# Patient Record
Sex: Female | Born: 2000
Health system: Southern US, Community
[De-identification: ages and names within clinical notes are randomized; demographics above are authoritative.]

## PROBLEM LIST (undated history)

## (undated) HISTORY — PX: NO PAST SURGERIES: SHX2092

---
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
## 2018-03-03 ENCOUNTER — Other Ambulatory Visit: Payer: Self-pay

## 2018-03-03 ENCOUNTER — Ambulatory Visit
Admission: EM | Admit: 2018-03-03 | Discharge: 2018-03-03 | Disposition: A | Discharge status: Home / Self Care | Payer: No Typology Code available for payment source | Attending: Family Medicine | Admitting: Family Medicine

## 2018-03-03 DIAGNOSIS — J069 Acute upper respiratory infection, unspecified: Secondary | ICD-10-CM | POA: Diagnosis not present

## 2018-03-03 DIAGNOSIS — R0981 Nasal congestion: Secondary | ICD-10-CM | POA: Diagnosis not present

## 2018-03-03 MED ORDER — FLUTICASONE PROPIONATE 50 MCG/ACT NA SUSP: 2.0000 | Freq: Every day | NASAL | 0 refills | Status: DC

## 2018-03-03 MED ORDER — PROMETHAZINE-DM 6.25-15 MG/5ML PO SYRP: 5.0000 mL | ORAL_SOLUTION | Freq: Every evening | ORAL | 0 refills | Status: DC | PRN

## 2018-03-03 NOTE — Discharge Instructions (Signed)
Please continue with over-the-counter Mucinex for cough, congestion and runny nose.  Take Flonase daily.  You may use nighttime cough medication as needed.  Please make sure you are drinking lots of fluids.  If any fevers, shortness of breath, worsening symptoms or urgent changes in her health return to the urgent care facility or ED.

## 2018-03-03 NOTE — ED Triage Notes (Signed)
Pt with headache, nasal congestion, cough, and sore throat starting on Tuesday. Sore throat subsided on Wednesday.

## 2018-03-03 NOTE — ED Provider Notes (Signed)
[facility]    CSN: [id number] Arrival date & time: 03/03/18  1004     History   Chief Complaint Chief Complaint  Patient presents with  . Nasal Congestion  . Headache    HPI Alexandria Hernandez is a 17 y.o. female presents to the urgent care facility for evaluation of sore throat, congestion, headache/ sinus pain, pressure and ear popping.  Symptoms began 3 days ago.  She currently states he is no longer having any sore throat.  Her cough is mild and her chief complaint today is mild nasal congestion.  Patient states she took Mucinex last night which seemed to help with her symptoms.  Her nighttime cough is improving.  She is having a hard time breathing through her nose at nighttime.  She denies any fevers, nausea, vomiting, body aches, headaches.  HPI  History reviewed. No pertinent past medical history.  There are no active problems to display for this patient.   History reviewed. No pertinent surgical history.  OB History    No data available       Home Medications    Prior to Admission medications   Medication Sig Start Date End Date Taking? Authorizing Provider  fluticasone (FLONASE) 50 MCG/ACT nasal spray Place 2 sprays into both nostrils daily. 03/03/18   Evon SlackGaines, Kairah Leoni C, PA-C  promethazine-dextromethorphan (PROMETHAZINE-DM) 6.25-15 MG/5ML syrup Take 5 mLs by mouth at bedtime as needed for cough. 03/03/18   Evon SlackGaines, Laquanda Bick C, PA-C    Family History History reviewed. No pertinent family history.  Social History Social History   Tobacco Use  . Smoking status: Never Smoker  . Smokeless tobacco: Never Used  Substance Use Topics  . Alcohol use: No    Frequency: Never  . Drug use: No     Allergies   Patient has no known allergies.   Review of Systems Review of Systems  Constitutional: Negative for fever.  HENT: Positive for congestion and rhinorrhea. Negative for ear discharge, sinus pressure, sinus pain, sore throat, trouble swallowing and  voice change.   Respiratory: Positive for cough. Negative for shortness of breath, wheezing and stridor.   Cardiovascular: Negative for chest pain.  Gastrointestinal: Negative for abdominal pain, diarrhea, nausea and vomiting.  Genitourinary: Negative for dysuria, flank pain and pelvic pain.  Musculoskeletal: Positive for myalgias. Negative for back pain.  Skin: Negative for rash.  Neurological: Negative for dizziness and headaches.     Physical Exam Triage Vital Signs ED Triage Vitals  Enc Vitals Group     BP 03/03/18 1017 123/77     Pulse Rate 03/03/18 1017 84     Resp 03/03/18 1017 16     Temp 03/03/18 1017 98.6 F (37 C)     Temp Source 03/03/18 1017 Oral     SpO2 03/03/18 1017 100 %     Weight 03/03/18 1018 116 lb (52.6 kg)     Height 03/03/18 1018 5\' 4"  (1.626 m)     Head Circumference --      Peak Flow --      Pain Score 03/03/18 1016 4     Pain Loc --      Pain Edu? --      Excl. in GC? --    No data found.  Updated Vital Signs BP 123/77 (BP Location: Left Arm)   Pulse 84   Temp 98.6 F (37 C) (Oral)   Resp 16   Ht 5\' 4"  (1.626 m)   Wt 116 lb (52.6  kg)   LMP 01/23/2018   SpO2 100%   BMI 19.91 kg/m   Visual Acuity Right Eye Distance:   Left Eye Distance:   Bilateral Distance:    Right Eye Near:   Left Eye Near:    Bilateral Near:     Physical Exam  Constitutional: She is oriented to person, place, and time. She appears well-developed and well-nourished. No distress.  HENT:  Head: Normocephalic and atraumatic.  Right Ear: Hearing, tympanic membrane, external ear and ear canal normal.  Left Ear: Hearing, tympanic membrane, external ear and ear canal normal.  Nose: Rhinorrhea present.  Mouth/Throat: Mucous membranes are normal. No trismus in the jaw. No uvula swelling. Posterior oropharyngeal erythema present. No oropharyngeal exudate, posterior oropharyngeal edema or tonsillar abscesses. No tonsillar exudate.  No sinus tenderness to palpation.    Eyes: Conjunctivae are normal. Right eye exhibits no discharge. Left eye exhibits no discharge.  Neck: Normal range of motion. No neck rigidity.  Cardiovascular: Normal rate and regular rhythm.  Pulmonary/Chest: Effort normal and breath sounds normal. No stridor. No respiratory distress. She has no wheezes. She has no rales.  Abdominal: Soft. She exhibits no distension. There is no tenderness.  Musculoskeletal: Normal range of motion. She exhibits no deformity.  Lymphadenopathy:    She has cervical adenopathy.  Neurological: She is alert and oriented to person, place, and time. She has normal reflexes.  Skin: Skin is warm and dry.  Psychiatric: She has a normal mood and affect. Her behavior is normal. Thought content normal.     UC Treatments / Results  Labs (all labs ordered are listed, but only abnormal results are displayed) Labs Reviewed - No data to display  EKG  EKG Interpretation None       Radiology No results found.  Procedures Procedures (including critical care time)  Medications Ordered in UC Medications - No data to display   Initial Impression / Assessment and Plan / UC Course  I have reviewed the triage vital signs and the nursing notes.  Pertinent labs & imaging results that were available during my care of the patient were reviewed by me and considered in my medical decision making (see chart for details).     17 year old female with nasal congestion, improving cough with mild sinus pressure.  No fevers or sinus tenderness to palpation.  Symptoms improving with Mucinex.  Patient given prescription for Flonase along with dextran with Lorfan with Phenergan.  Patient will take these medications as prescribed she is educated on signs and symptoms return to the urgent care facility for. Final Clinical Impressions(s) / UC Diagnoses   Final diagnoses:  Nasal congestion  Viral upper respiratory tract infection    ED Discharge Orders        Ordered     fluticasone (FLONASE) 50 MCG/ACT nasal spray  Daily     03/03/18 1028    promethazine-dextromethorphan (PROMETHAZINE-DM) 6.25-15 MG/5ML syrup  At bedtime PRN     03/03/18 1028          Evon Slack, [place] 03/03/18 1114

## 2019-08-14 ENCOUNTER — Ambulatory Visit
Admission: EM | Admit: 2019-08-14 | Discharge: 2019-08-14 | Disposition: A | Discharge status: Home / Self Care | Payer: 59 | Attending: Family Medicine | Admitting: Family Medicine

## 2019-08-14 ENCOUNTER — Encounter: Payer: Self-pay | Admitting: Emergency Medicine

## 2019-08-14 ENCOUNTER — Other Ambulatory Visit: Payer: Self-pay

## 2019-08-14 DIAGNOSIS — Z20828 Contact with and (suspected) exposure to other viral communicable diseases: Secondary | ICD-10-CM | POA: Diagnosis not present

## 2019-08-14 DIAGNOSIS — Z20822 Contact with and (suspected) exposure to covid-19: Secondary | ICD-10-CM

## 2019-08-14 NOTE — Discharge Instructions (Signed)
We will call or send mychart with the results.  Take care  Dr. Lacinda Axon

## 2019-08-14 NOTE — ED Triage Notes (Signed)
Patient states a co-workers mother tested positive for COVID and she is wanting to be tested. Patient does not have any symptoms.

## 2019-08-15 LAB — NOVEL CORONAVIRUS, NAA (HOSP ORDER, SEND-OUT TO REF LAB; TAT 18-24 HRS): SARS-CoV-2, NAA: NOT DETECTED

## 2019-08-15 NOTE — ED Provider Notes (Signed)
[facility]    CSN: [id number] Arrival date & time: 08/14/19  1413  History   Chief Complaint Chief Complaint  Patient presents with  . COVID testing   HPI  18 year old female presents with the above complaint.  Patient states that a coworkers mother tested positive for COVID-19. 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 Denies cough, SOB, fever. She states that she is feeling well. No other reported symptoms or complaints. Desires testing today.   Hx reviewed as below. PMH: Hx of anxiety, Hx of abdominal pain  Surgical Hx: None.   OB History   No obstetric history on file.     Home Medications    Prior to Admission medications   Medication Sig Start Date End Date Taking? Authorizing Provider  fluticasone (FLONASE) 50 MCG/ACT nasal spray Place 2 sprays into both nostrils daily. 03/03/18 08/14/19  Evon SlackGaines, Thomas C, PA-C   Social History Social History   Tobacco Use  . Smoking status: Never Smoker  . Smokeless tobacco: Never Used  Substance Use Topics  . Alcohol use: No    Frequency: Never  . Drug use: No     Allergies   Patient has no known allergies.   Review of Systems Review of Systems  Constitutional: Negative for fever.  Respiratory: Negative for cough and shortness of breath.    Physical Exam Triage Vital Signs ED Triage Vitals  Enc Vitals Group     BP 08/14/19 1436 119/70     Pulse Rate 08/14/19 1436 90     Resp 08/14/19 1436 18     Temp 08/14/19 1436 98.9 F (37.2 C)     Temp Source 08/14/19 1436 Oral     SpO2 08/14/19 1436 99 %     Weight 08/14/19 1433 120 lb (54.4 kg)     Height 08/14/19 1433 5\' 3"  (1.6 m)     Head Circumference --      Peak Flow --      Pain Score 08/14/19 1433 0     Pain Loc --      Pain Edu? --      Excl. in GC? --    Updated Vital Signs BP 119/70 (BP Location: Right Arm)   Pulse 90   Temp 98.9 F (37.2 C) (Oral)   Resp 18   Ht 5\' 3"  (1.6 m)   Wt 54.4 kg   SpO2 99%   BMI 21.26  kg/m   Visual Acuity Right Eye Distance:   Left Eye Distance:   Bilateral Distance:    Right Eye Near:   Left Eye Near:    Bilateral Near:     Physical Exam Vitals signs and nursing note reviewed.  Constitutional:      General: She is not in acute distress.    Appearance: Normal appearance. She is not ill-appearing.  HENT:     Head: Normocephalic and atraumatic.     Nose: Nose normal.  Eyes:     General:        Right eye: No discharge.        Left eye: No discharge.     Conjunctiva/sclera: Conjunctivae normal.  Cardiovascular:     Rate and Rhythm: Normal rate and regular rhythm.     Heart sounds: No murmur.  Pulmonary:     Effort: Pulmonary effort is normal.     Breath sounds: Normal breath sounds. No wheezing, rhonchi or rales.  Neurological:     Mental Status: She  is alert.  Psychiatric:        Mood and Affect: Mood normal.        Behavior: Behavior normal.    UC Treatments / Results  Labs (all labs ordered are listed, but only abnormal results are displayed) Labs Reviewed  NOVEL CORONAVIRUS, NAA (HOSP ORDER, SEND-OUT TO REF LAB; TAT 18-24 HRS)    EKG   Radiology No results found.  Procedures Procedures (including critical care time)  Medications Ordered in UC Medications - No data to display  Initial Impression / Assessment and Plan / UC Course  I have reviewed the triage vital signs and the nursing notes.  Pertinent labs & imaging results that were available during my care of the patient were reviewed by me and considered in my medical decision making (see chart for details).    18 year old female presents for COVID testing. Testing done today. Work note given.  Final Clinical Impressions(s) / UC Diagnoses   Final diagnoses:  Exposure to Covid-19 Virus     Discharge Instructions     We will call or send mychart with the results.  Take care  Dr. Lacinda Axon    ED Prescriptions    None     Controlled Substance Prescriptions [place]  Controlled Substance Registry consulted? Not Applicable   Coral Spikes, [place] 08/16/19 [id number]

## 2020-07-16 ENCOUNTER — Ambulatory Visit (INDEPENDENT_AMBULATORY_CARE_PROVIDER_SITE_OTHER): Payer: Medicaid Other

## 2020-07-16 ENCOUNTER — Other Ambulatory Visit: Payer: Self-pay

## 2020-07-16 ENCOUNTER — Ambulatory Visit
Admission: RE | Admit: 2020-07-16 | Discharge: 2020-07-16 | Disposition: A | Discharge status: Home / Self Care | Payer: Medicaid Other | Source: Ambulatory Visit | Attending: Family Medicine | Admitting: Family Medicine

## 2020-07-16 VITALS — BP 116/72 | HR 88 | Temp 98.4°F | Resp 18 | Ht 63.0 in | Wt 119.9 lb

## 2020-07-16 DIAGNOSIS — M25572 Pain in left ankle and joints of left foot: Secondary | ICD-10-CM

## 2020-07-16 MED ORDER — MELOXICAM 15 MG PO TABS: 15.0000 mg | ORAL_TABLET | Freq: Every day | ORAL | 0 refills | Status: DC | PRN

## 2020-07-16 NOTE — Discharge Instructions (Addendum)
Take medication as prescribed. Rest. Drink plenty of fluids. Use boot.   Follow up with orthopedic for continued pain. See above.   Follow up with your primary care physician this week as needed. Return to Urgent care for new or worsening concerns.

## 2020-07-16 NOTE — ED Triage Notes (Signed)
Pt c/o left ankle pain. She states she injured her ankle and was treated for a ankle sprain she was suppose to take off work but was unable to. She is having continued pain. This occurred over a month ago.

## 2020-07-16 NOTE — ED Provider Notes (Signed)
[facility] ____________________________________________  Time seen: Approximately 12:28 PM  I have reviewed the triage vital signs and the nursing notes.   HISTORY  Chief Complaint Appointment and Ankle Pain (left)   HPI Alexandria Hernandez is a 19 y.o. female presenting for evaluation of left ankle pain.  Patient reports she began having ankle pain approximately 1 month ago.  States she was at work and walking and unsure if she twisted her ankle or onset of injury, but reports pain started.  Patient states she was seen by her primary care and it was suspected that she had ankle sprain and was encouraged to rest for 2 weeks.  States she had a continue to work and remained on her feet and did not rest.  States she is still having pain to the same area.  Has not had previous imaging.  Denies pain radiation, paresthesias, swelling or other injuries.  Has not taken over-the-counter medication for the same complaints.  Occasionally soaks the area.  Denies other alleviating measures.  Reports otherwise doing well.  No recent sickness.  No LMP recorded. Patient has had an injection.  Denies pregnancy.     History reviewed. No pertinent past medical history.  There are no problems to display for this patient.   History reviewed. No pertinent surgical history.   No current facility-administered medications for this encounter.  Current Outpatient Medications:  .  meloxicam (MOBIC) 15 MG tablet, Take 1 tablet (15 mg total) by mouth daily as needed., Disp: 20 tablet, Rfl: 0  Allergies Patient has no known allergies.  History reviewed. No pertinent family history.  Social History Social History   Tobacco Use  . Smoking status: Never Smoker  . Smokeless tobacco: Never Used  Vaping Use  . Vaping Use: Never used  Substance Use Topics  . Alcohol use: No  . Drug use: No    Review of Systems Constitutional: No fever Cardiovascular: Denies chest pain. Respiratory: Denies  shortness of breath. Musculoskeletal: Positive left ankle pain. Skin: Negative for rash. ____________________________________________   PHYSICAL EXAM:  VITAL SIGNS: ED Triage Vitals  Enc Vitals Group     BP 07/16/20 1203 116/72     Pulse Rate 07/16/20 1203 88     Resp 07/16/20 1203 18     Temp 07/16/20 1203 98.4 F (36.9 C)     Temp Source 07/16/20 1203 Oral     SpO2 07/16/20 1203 99 %     Weight 07/16/20 1200 119 lb 14.9 oz (54.4 kg)     Height 07/16/20 1200 5\' 3"  (1.6 m)     Head Circumference --      Peak Flow --      Pain Score 07/16/20 1200 5     Pain Loc --      Pain Edu? --      Excl. in GC? --     Constitutional: Alert and oriented. Well appearing and in no acute distress. Eyes: Conjunctivae are normal.  ENT      Head: Normocephalic and atraumatic. Cardiovascular: Good peripheral circulation. Respiratory: Normal respiratory effort without tachypnea nor retractions.  Musculoskeletal: Amatory steady gait.  Bilateral distal pedal pulses equal.  Left lateral ankle tenderness to palpation lateral malleolus and ATFL, no swelling, no ecchymosis, full range of motion present, left extremity otherwise nontender. Neurologic:  Normal speech and language. Skin:  Skin is warm, dry and intact. No rash noted. Psychiatric: Mood and affect are normal. Speech and behavior are normal. Patient exhibits appropriate insight  and judgment   ___________________________________________   LABS (all labs ordered are listed, but only abnormal results are displayed)  Labs Reviewed - No data to display ____________________________________________  RADIOLOGY  DG Ankle Complete Left  Result Date: 07/16/2020 CLINICAL DATA:  Pain following recent injury EXAM: LEFT ANKLE COMPLETE - 3+ VIEW COMPARISON:  None. FINDINGS: Frontal, oblique, and lateral views were obtained. No evident fracture or joint effusion. No appreciable joint space narrowing or erosion. Mortise appears intact. There is pes  planus. IMPRESSION: No evident fracture or arthropathy. Ankle mortise appears intact. There is pes planus. Electronically Signed   By: Bretta Bang III M.D.   On: 07/16/2020 12:30   ____________________________________________   PROCEDURES Procedures    INITIAL IMPRESSION / ASSESSMENT AND PLAN / ED COURSE  Pertinent labs & imaging results that were available during my care of the patient were reviewed by me and considered in my medical decision making (see chart for details).  Well-appearing patient.  No acute distress.  Left ankle pain as above.  Left ankle x-ray negative.  Suspect sprain versus tendinitis.  Will treat with Mobic and Cam walking boot.  Cam walker boot for 1 week, continue Mobic, rest.  Supportive care.  Follow-up with orthopedic as needed for continued pain.Discussed indication, risks and benefits of medications with patient.   Discussed follow up with Primary care physician this week. Discussed follow up and return parameters including no resolution or any worsening concerns. Patient verbalized understanding and agreed to plan.   ____________________________________________   FINAL CLINICAL IMPRESSION(S) / ED DIAGNOSES  Final diagnoses:  Acute left ankle pain     ED Discharge Orders         Ordered    meloxicam (MOBIC) 15 MG tablet  Daily PRN     Discontinue  Reprint     07/16/20 1238           Note: This dictation was prepared with Dragon dictation along with smaller phrase technology. Any transcriptional errors that result from this process are unintentional.         Renford Dills, NP 07/16/20 1310

## 2020-07-26 ENCOUNTER — Other Ambulatory Visit: Payer: Self-pay

## 2020-07-26 ENCOUNTER — Encounter: Payer: Self-pay | Admitting: Emergency Medicine

## 2020-07-26 DIAGNOSIS — R109 Unspecified abdominal pain: Secondary | ICD-10-CM | POA: Diagnosis not present

## 2020-07-26 DIAGNOSIS — Z5321 Procedure and treatment not carried out due to patient leaving prior to being seen by health care provider: Secondary | ICD-10-CM | POA: Insufficient documentation

## 2020-07-26 LAB — CBC
HCT: 36.6 % (ref 36.0–46.0)
Hemoglobin: 12.3 g/dL (ref 12.0–15.0)
MCH: 27.2 pg (ref 26.0–34.0)
MCHC: 33.6 g/dL (ref 30.0–36.0)
MCV: 81 fL (ref 80.0–100.0)
Platelets: 343 10*3/uL (ref 150–400)
RBC: 4.52 MIL/uL (ref 3.87–5.11)
RDW: 13.4 % (ref 11.5–15.5)
WBC: 9 10*3/uL (ref 4.0–10.5)
nRBC: 0 % (ref 0.0–0.2)

## 2020-07-26 LAB — URINALYSIS, COMPLETE (UACMP) WITH MICROSCOPIC
Bacteria, UA: NONE SEEN
Bilirubin Urine: NEGATIVE
Glucose, UA: NEGATIVE mg/dL
Hgb urine dipstick: NEGATIVE
Ketones, ur: NEGATIVE mg/dL
Nitrite: NEGATIVE
Protein, ur: NEGATIVE mg/dL
Specific Gravity, Urine: 1.024 (ref 1.005–1.030)
pH: 5 (ref 5.0–8.0)

## 2020-07-26 LAB — POCT PREGNANCY, URINE: Preg Test, Ur: NEGATIVE

## 2020-07-26 LAB — COMPREHENSIVE METABOLIC PANEL
ALT: 15 U/L (ref 0–44)
AST: 18 U/L (ref 15–41)
Albumin: 4.1 g/dL (ref 3.5–5.0)
Alkaline Phosphatase: 59 U/L (ref 38–126)
Anion gap: 9 (ref 5–15)
BUN: 11 mg/dL (ref 6–20)
CO2: 27 mmol/L (ref 22–32)
Calcium: 9.1 mg/dL (ref 8.9–10.3)
Chloride: 103 mmol/L (ref 98–111)
Creatinine, Ser: 0.66 mg/dL (ref 0.44–1.00)
GFR calc Af Amer: >60 mL/min (ref >60)
GFR calc non Af Amer: >60 mL/min (ref >60)
Glucose, Bld: 94 mg/dL (ref 70–99)
Potassium: 3.7 mmol/L (ref 3.5–5.1)
Sodium: 139 mmol/L (ref 135–145)
Total Bilirubin: 0.6 mg/dL (ref 0.3–1.2)
Total Protein: 8.5 g/dL — ABNORMAL HIGH (ref 6.5–8.1)

## 2020-07-26 LAB — LIPASE, BLOOD: Lipase: 33 U/L (ref 11–51)

## 2020-07-26 NOTE — ED Notes (Signed)
Attempted lab draw x 1, unsuccessful.  

## 2020-07-26 NOTE — ED Triage Notes (Signed)
Pt arrived via POV with reports of taking a new medication for 3 days for ankle injury. Pt states she has been having abdominal pain that feels like menstrual cramps and hunger pain feeling. Pt states the pain was worse at work last night and had to leave.  Pt states she was taking Meloxicam 15mg  daily which she states she took 3 doses of. Pt stopped the medication on Sunday or Monday of last week.   Pt denies any vomiting but reports diarrhea.

## 2020-07-27 ENCOUNTER — Ambulatory Visit
Admission: EM | Admit: 2020-07-27 | Discharge: 2020-07-27 | Disposition: A | Discharge status: Home / Self Care | Payer: Medicaid Other | Attending: Family Medicine | Admitting: Family Medicine

## 2020-07-27 ENCOUNTER — Encounter: Payer: Self-pay | Admitting: Emergency Medicine

## 2020-07-27 ENCOUNTER — Emergency Department
Admission: EM | Admit: 2020-07-27 | Discharge: 2020-07-27 | Disposition: A | Discharge status: Home / Self Care | Payer: Medicaid Other | Attending: Emergency Medicine | Admitting: Emergency Medicine

## 2020-07-27 ENCOUNTER — Ambulatory Visit: Payer: Self-pay

## 2020-07-27 DIAGNOSIS — R1032 Left lower quadrant pain: Secondary | ICD-10-CM

## 2020-07-27 DIAGNOSIS — R1031 Right lower quadrant pain: Secondary | ICD-10-CM | POA: Diagnosis not present

## 2020-07-27 MED ORDER — TRAMADOL HCL 50 MG PO TABS: 50.0000 mg | ORAL_TABLET | Freq: Three times a day (TID) | ORAL | 0 refills | Status: DC | PRN

## 2020-07-27 NOTE — Discharge Instructions (Signed)
Lots of fluids.  Medication as directed.  Follow up with Peds.  Take care  Dr. Adriana Simas

## 2020-07-27 NOTE — ED Triage Notes (Signed)
Patient in today c/o abdominal cramping and diarrhea x 1 week. Patient states the symptoms started ~2 days after starting Meloxicam that was prescribed on 07/16/20 visit. Patient went to [facility] ED last night, but did not stay due to the wait.

## 2020-07-27 NOTE — ED Notes (Signed)
Pt called in lobby, no answer.

## 2020-07-27 NOTE — ED Notes (Signed)
No answer  when called in lobby 

## 2020-07-28 NOTE — ED Provider Notes (Signed)
[facility]    CSN: [id number] Arrival date & time: 07/27/20  1859  History   Chief Complaint Chief Complaint  Patient presents with  . Abdominal Cramping  . Diarrhea    HPI  19 year old female presents with the above complaints.  Patient recently seen on 7/29.  Diagnosed with an ankle sprain.  Placed on meloxicam.  Lower abdominal cramping and has had some diarrhea.  She is concerned that this is side effects from medication.  She has now stopped the medication.  She has no fever.  She continues to have bilateral lower abdominal cramping despite cessation of medication.  Rates her pain as 5/10 in severity.  She is on Depo-Provera.  No relieving factors.  No other reported symptoms.  No other complaints.  Past Surgical History:  Procedure Laterality Date  . NO PAST SURGERIES      OB History   No obstetric history on file.      Home Medications    Prior to Admission medications   Medication Sig Start Date End Date Taking? Authorizing Provider  medroxyPROGESTERone (DEPO-PROVERA) 150 MG/ML injection Inject 150 mg into the muscle every 3 (three) months. 07/26/20  Yes [provider]  traMADol (ULTRAM) 50 MG tablet Take 1 tablet (50 mg total) by mouth every 8 (eight) hours as needed for moderate pain or severe pain. 07/27/20   Tommie Sams, DO  fluticasone (FLONASE) 50 MCG/ACT nasal spray Place 2 sprays into both nostrils daily. 03/03/18 08/14/19  Evon Slack, PA-C    Family History Family History  Problem Relation Age of Onset  . Hypertension Mother   . Healthy Father     Social History Social History   Tobacco Use  . Smoking status: Never Smoker  . Smokeless tobacco: Never Used  Vaping Use  . Vaping Use: Never used  Substance Use Topics  . Alcohol use: No  . Drug use: No     Allergies   Patient has no known allergies.   Review of Systems Review of Systems  Constitutional: Negative for fever.  Gastrointestinal: Positive for abdominal  pain and diarrhea.   Physical Exam Triage Vital Signs ED Triage Vitals [07/27/20 1929]  Enc Vitals Group     BP 114/82     Pulse Rate 88     Resp 18     Temp 98.4 F (36.9 C)     Temp Source Oral     SpO2 100 %     Weight 150 lb (68 kg)     Height 5\' 6"  (1.676 m)     Head Circumference      Peak Flow      Pain Score 7     Pain Loc      Pain Edu?      Excl. in GC?    No data found.  Updated Vital Signs BP 114/82 (BP Location: Left Arm)   Pulse 88   Temp 98.4 F (36.9 C) (Oral)   Resp 18   Ht 5\' 6"  (1.676 m)   Wt 68 kg   SpO2 100%   BMI 24.21 kg/m   Visual Acuity Right Eye Distance:   Left Eye Distance:   Bilateral Distance:    Right Eye Near:   Left Eye Near:    Bilateral Near:     Physical Exam Constitutional:      General: She is not in acute distress.    Appearance: Normal appearance. She is not ill-appearing.  HENT:  Head: Normocephalic and atraumatic.  Eyes:     General:        Right eye: No discharge.        Left eye: No discharge.     Conjunctiva/sclera: Conjunctivae normal.  Cardiovascular:     Rate and Rhythm: Normal rate and regular rhythm.  Pulmonary:     Effort: Pulmonary effort is normal.     Breath sounds: Normal breath sounds.  Abdominal:     General: There is no distension.     Palpations: Abdomen is soft.     Tenderness: There is no abdominal tenderness. There is no rebound.     Comments: Mild tenderness in the lower abdomen.   Neurological:     Mental Status: She is alert.  Psychiatric:        Mood and Affect: Mood normal.        Behavior: Behavior normal.    UC Treatments / Results  Labs (all labs ordered are listed, but only abnormal results are displayed) Labs Reviewed - No data to display  EKG   Radiology No results found.  Procedures Procedures (including critical care time)  Medications Ordered in UC Medications - No data to display  Initial Impression / Assessment and Plan / UC Course  I have  reviewed the triage vital signs and the nursing notes.  Pertinent labs & imaging results that were available during my care of the patient were reviewed by me and considered in my medical decision making (see chart for details).    19 year old female presents with bilateral lower abdominal pain and recent diarrhea.  Went to the ER but was not seen on 8/8.  She had a negative pregnancy test, normal labs and normal urine.  Advised her that I do not believe that this is secondary to the medication at this time.  Continue cessation of medication.  Tramadol as needed for pain.  Final Clinical Impressions(s) / UC Diagnoses   Final diagnoses:  Bilateral lower abdominal pain     Discharge Instructions     Lots of fluids.  Medication as directed.  Follow up with Peds.  Take care  Dr. Adriana Simas    ED Prescriptions    Medication Sig Dispense Auth. Provider   traMADol (ULTRAM) 50 MG tablet Take 1 tablet (50 mg total) by mouth every 8 (eight) hours as needed for moderate pain or severe pain. 10 tablet Everlene Other G, DO     I have reviewed the PDMP during this encounter.   Tommie Sams, [place] 07/28/20 2120

## 2021-01-22 ENCOUNTER — Ambulatory Visit
Admission: RE | Admit: 2021-01-22 | Discharge: 2021-01-22 | Disposition: A | Discharge status: Home / Self Care | Payer: Medicaid Other | Source: Ambulatory Visit | Attending: Sports Medicine | Admitting: Sports Medicine

## 2021-01-22 ENCOUNTER — Other Ambulatory Visit: Payer: Self-pay

## 2021-01-22 VITALS — BP 125/75 | HR 94 | Temp 98.8°F | Resp 18 | Ht 66.0 in | Wt 149.9 lb

## 2021-01-22 DIAGNOSIS — M24272 Disorder of ligament, left ankle: Secondary | ICD-10-CM

## 2021-01-22 DIAGNOSIS — M2141 Flat foot [pes planus] (acquired), right foot: Secondary | ICD-10-CM

## 2021-01-22 DIAGNOSIS — M25572 Pain in left ankle and joints of left foot: Secondary | ICD-10-CM

## 2021-01-22 DIAGNOSIS — M2142 Flat foot [pes planus] (acquired), left foot: Secondary | ICD-10-CM

## 2021-01-22 DIAGNOSIS — G8929 Other chronic pain: Secondary | ICD-10-CM | POA: Diagnosis not present

## 2021-01-22 MED ORDER — MELOXICAM 7.5 MG PO TABS: 7.5000 mg | ORAL_TABLET | Freq: Every day | ORAL | 0 refills | Status: DC

## 2021-01-22 NOTE — ED Triage Notes (Signed)
Pt c/o left ankle pain. She states she was seen for this back in July but states the medication she was given made her sick and her ankle is not any better.

## 2021-01-22 NOTE — ED Provider Notes (Addendum)
[facility]    CSN: [id number] Arrival date & time: 01/22/21  1347      History   Chief Complaint Chief Complaint  Patient presents with  . Ankle Pain    left    HPI Alexandria Hernandez is a 20 y.o. female.   Pleasant 20 year old female who presents for evaluation of the above issue.  She reports about 8 months of left lateral ankle pain.  No accidents trauma or falls.  No swelling bruising or redness.  She points to the lateral gutter and over the ATFL as the point of maximal tenderness.  She was seen back in July 2021 here in the urgent care and was prescribed meloxicam.  She took it but it upset her stomach.  Admittedly she did not take it with food.  She was also given a walking boot which she only use for a few days.  She works at [employer] in [place] and is on her feet all day long.  She says it is worse if she has a long shift.  She does have a family history of eczema but no personal or family history of psoriasis or rheumatoid arthritis.  She has not noticed a rash.  There is no swelling of her fingers or toes.  No evidence of any psoriatic arthritis.  No other issues or problems were offered.  She used to see [facility] pediatrics but now that she is 19 she does not have a primary care physician.     History reviewed. No pertinent past medical history.  There are no problems to display for this patient.   Past Surgical History:  Procedure Laterality Date  . NO PAST SURGERIES      OB History   No obstetric history on file.      Home Medications    Prior to Admission medications   Medication Sig Start Date End Date Taking? Authorizing Provider  meloxicam (MOBIC) 7.5 MG tablet Take 1 tablet (7.5 mg total) by mouth daily. 01/22/21  Yes Delton See, MD  medroxyPROGESTERone (DEPO-PROVERA) 150 MG/ML injection Inject 150 mg into the muscle every 3 (three) months. 07/26/20   [provider]  traMADol (ULTRAM) 50 MG tablet Take 1 tablet (50  mg total) by mouth every 8 (eight) hours as needed for moderate pain or severe pain. 07/27/20   Tommie Sams, DO  fluticasone (FLONASE) 50 MCG/ACT nasal spray Place 2 sprays into both nostrils daily. 03/03/18 08/14/19  Evon Slack, PA-C    Family History Family History  Problem Relation Age of Onset  . Hypertension Mother   . Healthy Father     Social History Social History   Tobacco Use  . Smoking status: Never Smoker  . Smokeless tobacco: Never Used  Vaping Use  . Vaping Use: Never used  Substance Use Topics  . Alcohol use: No  . Drug use: No     Allergies   Patient has no known allergies.   Review of Systems Review of Systems  Constitutional: Negative for activity change, appetite change, chills and fever.  HENT: Negative.   Eyes: Negative.   Respiratory: Negative.   Cardiovascular: Negative.   Gastrointestinal: Negative.   Genitourinary: Negative.   Musculoskeletal: Positive for arthralgias. Negative for back pain, joint swelling, myalgias, neck pain and neck stiffness.  Skin: Negative for color change, pallor, rash and wound.  Neurological: Negative for dizziness, light-headedness and headaches.  All other systems reviewed and are negative.    Physical  Exam Triage Vital Signs ED Triage Vitals  Enc Vitals Group     BP 01/22/21 1359 125/75     Pulse Rate 01/22/21 1359 94     Resp 01/22/21 1359 18     Temp 01/22/21 1359 98.8 F (37.1 C)     Temp Source 01/22/21 1359 Oral     SpO2 01/22/21 1359 100 %     Weight 01/22/21 1357 149 lb 14.6 oz (68 kg)     Height 01/22/21 1357 5\' 6"  (1.676 m)     Head Circumference --      Peak Flow --      Pain Score 01/22/21 1357 5     Pain Loc --      Pain Edu? --      Excl. in GC? --    No data found.  Updated Vital Signs BP 125/75 (BP Location: Right Arm)   Pulse 94   Temp 98.8 F (37.1 C) (Oral)   Resp 18   Ht 5\' 6"  (1.676 m)   Wt 68 kg   SpO2 100%   BMI 24.20 kg/m   Visual Acuity Right Eye  Distance:   Left Eye Distance:   Bilateral Distance:    Right Eye Near:   Left Eye Near:    Bilateral Near:     Physical Exam Vitals and nursing note reviewed.  Constitutional:      General: She is not in acute distress.    Appearance: Normal appearance. She is not ill-appearing, toxic-appearing or diaphoretic.  HENT:     Head: Normocephalic and atraumatic.  Musculoskeletal:     Comments: Right foot and ankle: She does have pes planus.  Good range of motion.  No tenderness to palpation.  No bony abnormality ecchymosis or erythema.  Strength is well-preserved in all planes.  Left foot and ankle no bony abnormality ecchymosis erythema soft tissue swelling.  There is no joint effusion.  She has good range of motion equal to the contralateral side.  No strength deficits noted.  No evidence of any tendon retraction.  Palpation produces tenderness in the lateral gutter especially over the ATFL.  Achilles tendon is intact without nodularity.  Homans and Ackerly test are negative.  There is no midfoot instability.  Anterior drawer produces laxity when compared to the contralateral side.  No evidence of any syndesmotic injury.  Neurovascular normal sensation 2+ pulses distally.  Skin:    General: Skin is warm and dry.     Capillary Refill: Capillary refill takes less than 2 seconds.  Neurological:     General: No focal deficit present.     Mental Status: She is alert and oriented to person, place, and time.  Psychiatric:        Mood and Affect: Mood normal.        Behavior: Behavior normal.      UC Treatments / Results  Labs (all labs ordered are listed, but only abnormal results are displayed) Labs Reviewed - No data to display  EKG   Radiology No results found.     EXAM: 07/16/2021 LEFT ANKLE COMPLETE - 3+ VIEW  COMPARISON:  None.  FINDINGS: Frontal, oblique, and lateral views were obtained. No evident fracture or joint effusion. No appreciable joint space narrowing  or erosion. Mortise appears intact. There is pes planus.  IMPRESSION: No evident fracture or arthropathy. Ankle mortise appears intact. There is pes planus.   Procedures Procedures (including critical care time)  Medications Ordered in UC Medications -  No data to display  Initial Impression / Assessment and Plan / UC Course  I have reviewed the triage vital signs and the nursing notes.  Pertinent labs & imaging results that were available during my care of the patient were reviewed by me and considered in my medical decision making (see chart for details).  Clinical impression: Chronic left lateral ankle pain with laxity found on examination.  Complicating her situation she has pes planus bilaterally.  Treatment plan: 1.  The findings and treatment plan were discussed in detail with the patient.  Patient was in agreement. 2.  I am going to renew the meloxicam but I have asked her to take it with food. 3.  Asked her to go back in the boot until she is seen by podiatry.  We will make a podiatry referral.  She may need physical therapy.  She also may benefit from some orthotics. 4.  Icing, activity modification, when she is finished the meloxicam she can transition to over-the-counter ibuprofen, Advil, Aleve. 5.  Offered a work note but she deferred.  She says she is not working today. 6.  Discharge from care and follow-up here as needed.     Final Clinical Impressions(s) / UC Diagnoses   Final diagnoses:  Chronic pain of left ankle  Ligamentous laxity of ankle, left  Pes planus of both feet     Discharge Instructions     Renew the meloxicam but I have asked her to take it with food. Go back in the boot until she is seen by podiatry.  We will make a podiatry referral.  She may need physical therapy.  She also may benefit from some orthotics. Icing, activity modification, when she is finished the meloxicam she can transition to over-the-counter ibuprofen, Advil,  Aleve. Offered a work note but she deferred.  She says she is not working today. Follow-up here as needed.  I hope you get to feeling better, Dr. Zachery Dauer    ED Prescriptions    Medication Sig Dispense Auth. Provider   meloxicam (MOBIC) 7.5 MG tablet Take 1 tablet (7.5 mg total) by mouth daily. 30 tablet Delton See, MD     PDMP not reviewed this encounter.   Delton See, MD 01/22/21 1446    Delton See, MD 01/22/21 [phone]

## 2021-01-22 NOTE — Discharge Instructions (Addendum)
Renew the meloxicam but I have asked her to take it with food. Go back in the boot until she is seen by podiatry.  We will make a podiatry referral.  She may need physical therapy.  She also may benefit from some orthotics. Icing, activity modification, when she is finished the meloxicam she can transition to over-the-counter ibuprofen, Advil, Aleve. Offered a work note but she deferred.  She says she is not working today. Follow-up here as needed.  I hope you get to feeling better, Dr. Zachery Dauer

## 2021-04-20 ENCOUNTER — Emergency Department
Admit: 2021-04-20 | Discharge status: Absent without leave | Payer: Self-pay | Attending: Family Medicine | Admitting: Family Medicine

## 2021-05-16 IMAGING — CR DG ANKLE COMPLETE 3+V*L*
3 series · 3 of 3 positions shown · non-contrast
Comparison: None.

CLINICAL DATA: Pain following recent injury

EXAM:
LEFT ANKLE COMPLETE - 3+ VIEW

[ankle ap]
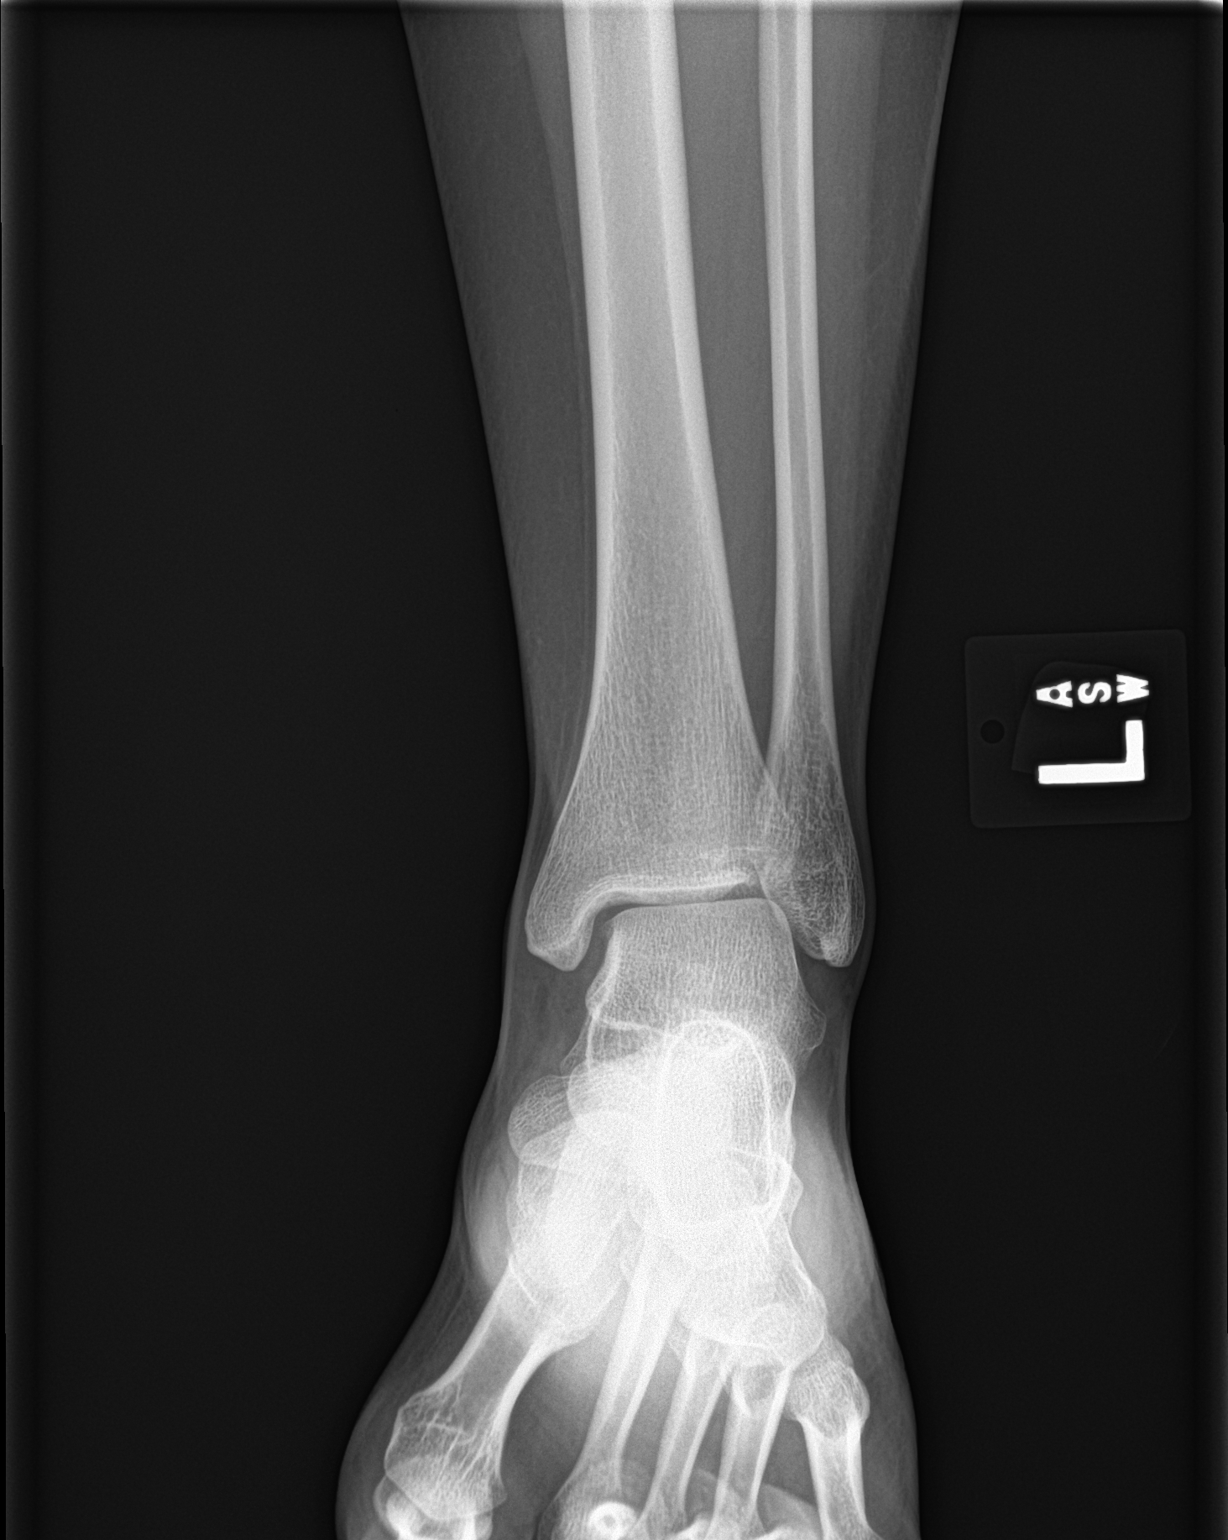

[ankle obl]
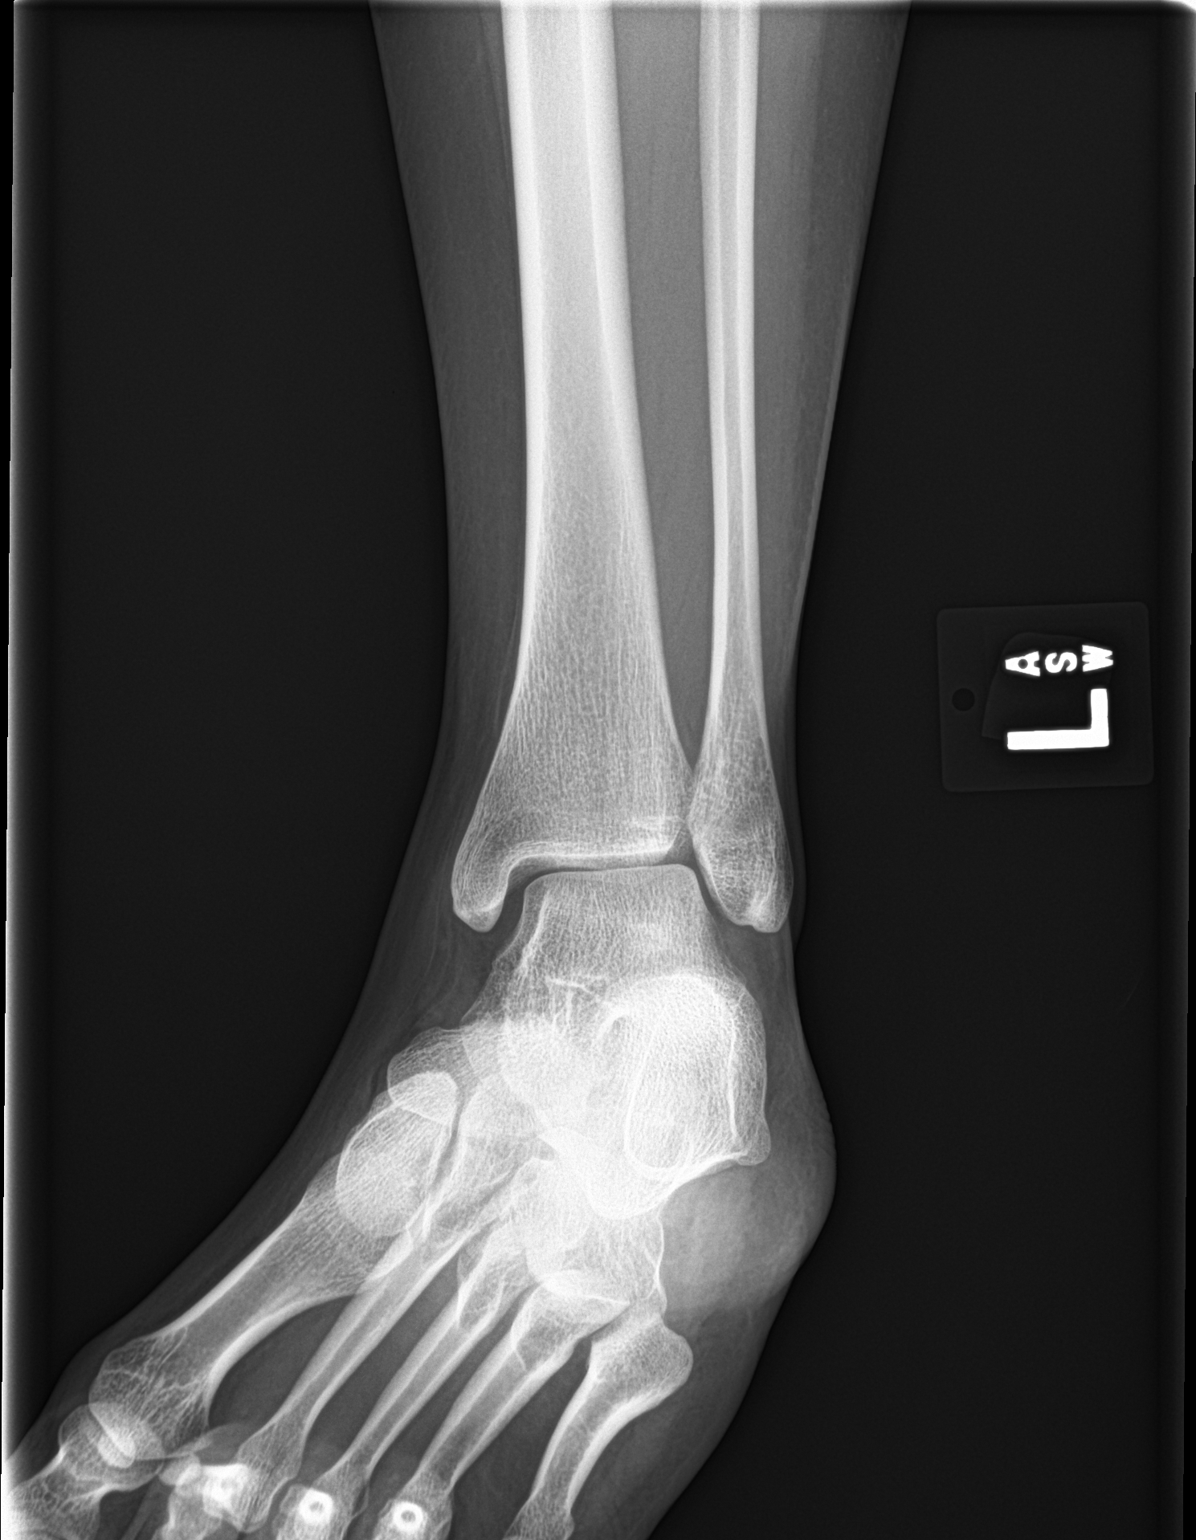

[ankle lat]
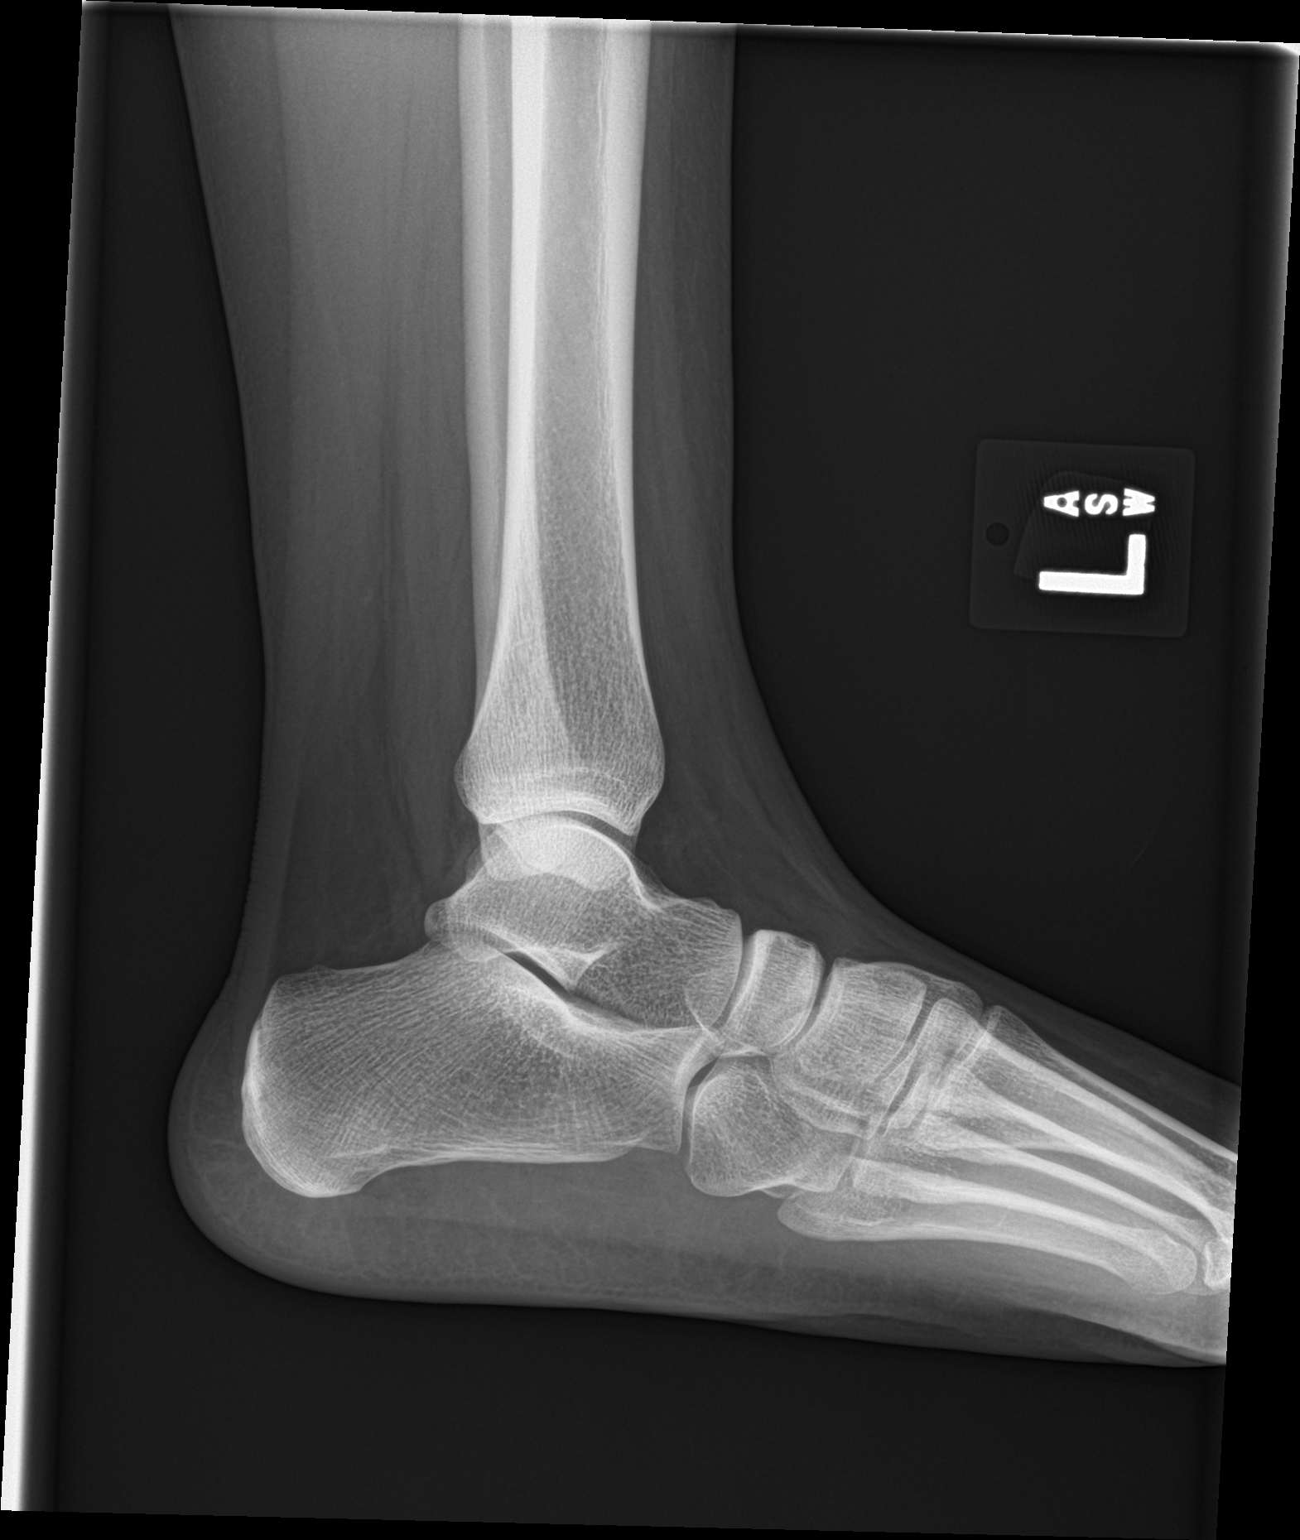

[3 of 3 positions shown; findings below may reference images not displayed]

FINDINGS: Frontal, oblique, and lateral views were obtained. No evident
fracture or joint effusion. No appreciable joint space narrowing or
erosion. Mortise appears intact. There is pes planus.
IMPRESSION: No evident fracture or arthropathy. Ankle mortise appears intact.
There is pes planus.

## 2021-06-02 ENCOUNTER — Encounter: Payer: Self-pay | Admitting: *Deleted

## 2021-06-02 ENCOUNTER — Other Ambulatory Visit: Payer: Self-pay

## 2021-06-02 ENCOUNTER — Emergency Department
Admission: EM | Admit: 2021-06-02 | Discharge: 2021-06-02 | Disposition: A | Discharge status: Home / Self Care | Payer: Medicaid Other | Attending: Emergency Medicine | Admitting: Emergency Medicine

## 2021-06-02 DIAGNOSIS — T6594XA Toxic effect of unspecified substance, undetermined, initial encounter: Secondary | ICD-10-CM | POA: Diagnosis present

## 2021-06-02 DIAGNOSIS — T59894A Toxic effect of other specified gases, fumes and vapors, undetermined, initial encounter: Secondary | ICD-10-CM | POA: Diagnosis not present

## 2021-06-02 DIAGNOSIS — Y9289 Other specified places as the place of occurrence of the external cause: Secondary | ICD-10-CM | POA: Insufficient documentation

## 2021-06-02 DIAGNOSIS — Y990 Civilian activity done for income or pay: Secondary | ICD-10-CM | POA: Diagnosis not present

## 2021-06-02 DIAGNOSIS — T2662XA Corrosion of cornea and conjunctival sac, left eye, initial encounter: Secondary | ICD-10-CM | POA: Diagnosis not present

## 2021-06-02 DIAGNOSIS — X58XXXA Exposure to other specified factors, initial encounter: Secondary | ICD-10-CM | POA: Insufficient documentation

## 2021-06-02 MED ORDER — CIPROFLOXACIN HCL 0.3 % OP SOLN: 1.0000 [drp] | OPHTHALMIC | 0 refills | Status: DC

## 2021-06-02 MED ORDER — FLUORESCEIN SODIUM 1 MG OP STRP
2.0000 | ORAL_STRIP | Freq: Once | OPHTHALMIC | Status: AC
  Administered 2021-06-02: 2 via OPHTHALMIC
  Filled 2021-06-02: qty 2

## 2021-06-02 MED ORDER — KETOROLAC TROMETHAMINE 0.5 % OP SOLN: 1.0000 [drp] | Freq: Four times a day (QID) | OPHTHALMIC | 0 refills | Status: DC

## 2021-06-02 MED ORDER — TETRACAINE HCL 0.5 % OP SOLN
2.0000 [drp] | Freq: Once | OPHTHALMIC | Status: AC
  Administered 2021-06-02: 2 [drp] via OPHTHALMIC
  Filled 2021-06-02: qty 4

## 2021-06-02 NOTE — ED Provider Notes (Signed)
[facility] Emergency Department Provider Note  ____________________________________________  Time seen: Approximately 5:47 PM  I have reviewed the triage vital signs and the nursing notes.   HISTORY  Chief Complaint Chemical Exposure    HPI Alexandria Hernandez is a 20 y.o. female who presents the emergency department after having bleach splashed into her eyes.  She was carrying a bucket of bleach at work when it splashed up on her hitting both her face and her chest.  Patient has a burning sensation to both eyes but no visual changes.  She does not wear glasses or contacts.  She flushed her eyes out copiously prior to arrival but still has a slight burning sensation bilaterally.  Patient denies any other injury or complaint.  She denies any introduction of bleach into the mouth.  No reported injuries to the skin of the chest wall.       No past medical history on file.  There are no problems to display for this patient.   Past Surgical History:  Procedure Laterality Date   NO PAST SURGERIES      Prior to Admission medications   Medication Sig Start Date End Date Taking? Authorizing Provider  ciprofloxacin (CILOXAN) 0.3 % ophthalmic solution Place 1 drop into the left eye every 2 (two) hours. Administer 1 drop, every 2 hours, while awake, for 2 days. Then 1 drop, every 4 hours, while awake, for the next 5 days. 06/02/21  Yes Kais Monje, Delorise Royals, PA-C  ketorolac (ACULAR) 0.5 % ophthalmic solution Place 1 drop into the right eye 4 (four) times daily. 06/02/21  Yes Sophiagrace Benbrook, Delorise Royals, PA-C  medroxyPROGESTERone (DEPO-PROVERA) 150 MG/ML injection Inject 150 mg into the muscle every 3 (three) months. 07/26/20   [provider]  fluticasone (FLONASE) 50 MCG/ACT nasal spray Place 2 sprays into both nostrils daily. 03/03/18 08/14/19  Evon Slack, PA-C    Allergies Patient has no known allergies.  Family History  Problem Relation Age of Onset    Hypertension Mother    Healthy Father     Social History Social History   Tobacco Use   Smoking status: Never   Smokeless tobacco: Never  Vaping Use   Vaping Use: Never used  Substance Use Topics   Alcohol use: No   Drug use: No     Review of Systems  Constitutional: No fever/chills Eyes: No visual changes. No discharge.  Chemical exposure to both eyes ENT: No upper respiratory complaints. Cardiovascular: no chest pain. Respiratory: no cough. No SOB. Gastrointestinal: No abdominal pain.  No nausea, no vomiting.  No diarrhea.  No constipation. Musculoskeletal: Negative for musculoskeletal pain. Skin: Negative for rash, abrasions, lacerations, ecchymosis. Neurological: Negative for headaches, focal weakness or numbness.  10 System ROS otherwise negative.  ____________________________________________   PHYSICAL EXAM:  VITAL SIGNS: ED Triage Vitals  Enc Vitals Group     BP 06/02/21 1727 133/83     Pulse Rate 06/02/21 1727 91     Resp 06/02/21 1727 18     Temp 06/02/21 1727 99.2 F (37.3 C)     Temp Source 06/02/21 1727 Oral     SpO2 06/02/21 1727 99 %     Weight 06/02/21 1729 180 lb (81.6 kg)     Height 06/02/21 1729 5\' 6"  (1.676 m)     Head Circumference --      Peak Flow --      Pain Score 06/02/21 1729 3     Pain Loc --  Pain Edu? --      Excl. in GC? --      Constitutional: Alert and oriented. Well appearing and in no acute distress. Eyes: Conjunctivae are normal. PERRL. EOMI. visualization of both eyes reveals no obvious conjunctival erythema.  There is no drainage.  Funduscopic exam reveals no visible abnormalities bilaterally.  Red reflex in vasculature and optic disc are unremarkable bilaterally.  Eyes are anesthetized bilaterally with tetracaine.  Fluorescein staining applied.  No areas of uptake to the right eye, area of uptake consistent with corneal burn to the left eye.  This appears very superficial in nature.  pH is tested in both eyes and is  between 7 and 8. Head: Atraumatic. ENT:      Ears:       Nose: No congestion/rhinnorhea.      Mouth/Throat: Mucous membranes are moist.  Neck: No stridor.    Cardiovascular: Normal rate, regular rhythm. Normal S1 and S2.  Good peripheral circulation. Respiratory: Normal respiratory effort without tachypnea or retractions. Lungs CTAB. Good air entry to the bases with no decreased or absent breath sounds. Musculoskeletal: Full range of motion to all extremities. No gross deformities appreciated. Neurologic:  Normal speech and language. No gross focal neurologic deficits are appreciated.  Skin:  Skin is warm, dry and intact. No rash noted. Psychiatric: Mood and affect are normal. Speech and behavior are normal. Patient exhibits appropriate insight and judgement.   ____________________________________________   LABS (all labs ordered are listed, but only abnormal results are displayed)  Labs Reviewed - No data to display ____________________________________________  EKG   ____________________________________________  RADIOLOGY   No results found.  ____________________________________________    PROCEDURES  Procedure(s) performed:    Procedures    Medications  fluorescein ophthalmic strip 2 strip (2 strips Both Eyes Given 06/02/21 1827)  tetracaine (PONTOCAINE) 0.5 % ophthalmic solution 2 drop (2 drops Both Eyes Given 06/02/21 1827)     ____________________________________________   INITIAL IMPRESSION / ASSESSMENT AND PLAN / ED COURSE  Pertinent labs & imaging results that were available during my care of the patient were reviewed by me and considered in my medical decision making (see chart for details).  Review of the [place] CSRS was performed in accordance of the [facility] prior to dispensing any controlled drugs.           Patient's diagnosis is consistent with chemical injury of the left conjunctivo-.  Patient presented to the emergency department after bleach  accidentally splashed into her face and eyes.  She arrived with a burning sensation to both eyes.  She had flushed her eyes immediately after this injury and visual exam revealed no conjunctival erythema or edema.  Patient did have area of uptake to the left eye consistent with a corneal burn from bleach.  pH prior to flushing was within normal limits.  Patient had flushing to both eyes with Morgan's lens.  She tolerated this well.  This time we will treat the patient prophylactically with antibiotic eyedrops, Acular for symptom relief and follow-up with ophthalmology.  Return precautions discussed with the patient. Patient is given ED precautions to return to the ED for any worsening or new symptoms.     ____________________________________________  FINAL CLINICAL IMPRESSION(S) / ED DIAGNOSES  Final diagnoses:  Chemical injury of left conjunctiva, initial encounter      NEW MEDICATIONS STARTED DURING THIS VISIT:  ED Discharge Orders          Ordered    ciprofloxacin (CILOXAN) 0.3 %  ophthalmic solution  Every 2 hours        06/02/21 1949    ketorolac (ACULAR) 0.5 % ophthalmic solution  4 times daily        06/02/21 1949                This chart was dictated using voice recognition software/Dragon. Despite best efforts to proofread, errors can occur which can change the meaning. Any change was purely unintentional.    Racheal Patches, PA-C 06/02/21 2025    Phineas Semen, MD 06/02/21 2125

## 2021-06-02 NOTE — ED Notes (Signed)
See triage note   States she had bleach splash in face  States she was able to wash her eyes out PTA    Both eyes red and irritated   Visual acuity 20/20 out of both eyes

## 2021-06-02 NOTE — ED Triage Notes (Addendum)
Pt reports spilling bleach at work today on self and it splashed in her face/eyes.  States eyes burn   Pt rinsed eyes out.  pt alert  speech clear.  States NO WC

## 2021-11-23 ENCOUNTER — Other Ambulatory Visit: Payer: Self-pay | Admitting: Podiatry

## 2021-11-23 ENCOUNTER — Ambulatory Visit (INDEPENDENT_AMBULATORY_CARE_PROVIDER_SITE_OTHER): Payer: Medicaid Other

## 2021-11-23 ENCOUNTER — Other Ambulatory Visit: Payer: Self-pay

## 2021-11-23 ENCOUNTER — Ambulatory Visit: Payer: Medicaid Other | Admitting: Podiatry

## 2021-11-23 DIAGNOSIS — M7672 Peroneal tendinitis, left leg: Secondary | ICD-10-CM

## 2021-11-23 DIAGNOSIS — M79672 Pain in left foot: Secondary | ICD-10-CM

## 2021-11-23 DIAGNOSIS — M25572 Pain in left ankle and joints of left foot: Secondary | ICD-10-CM

## 2021-11-23 NOTE — Progress Notes (Signed)
Subjective:  Patient ID: Alexandria Hernandez, female    DOB: 2001-08-02,  MRN: [id number]  No chief complaint on file.   20 y.o. female presents with the above complaint.  Patient presents with complaint of left lateral ankle pain.  Patient states been going for past 2 years.  She used to work at [employer] and constantly on her foot.  She states that it is very painful to touch now she is got more work from home job.  She wanted to get it evaluated.  She has been to urgent care 3 times she also wanted to see a foot and ankle specialist.  She states none of them really helped and has progressed to gotten worse.  She wanted get it evaluated.  She has failed chemotherapy for multiple months.  She has not made any shoe gear modifications.   Review of Systems: Negative except as noted in the HPI. Denies N/V/F/Ch.  No past medical history on file.  Current Outpatient Medications:    ciprofloxacin (CILOXAN) 0.3 % ophthalmic solution, Place 1 drop into the left eye every 2 (two) hours. Administer 1 drop, every 2 hours, while awake, for 2 days. Then 1 drop, every 4 hours, while awake, for the next 5 days., Disp: 5 mL, Rfl: 0   ketorolac (ACULAR) 0.5 % ophthalmic solution, Place 1 drop into the right eye 4 (four) times daily., Disp: 5 mL, Rfl: 0   medroxyPROGESTERone (DEPO-PROVERA) 150 MG/ML injection, Inject 150 mg into the muscle every 3 (three) months., Disp: , Rfl:   Social History   Tobacco Use  Smoking Status Never  Smokeless Tobacco Never    No Known Allergies Objective:  There were no vitals filed for this visit. There is no height or weight on file to calculate BMI. Constitutional Well developed. Well nourished.  Vascular Dorsalis pedis pulses palpable bilaterally. Posterior tibial pulses palpable bilaterally. Capillary refill normal to all digits.  No cyanosis or clubbing noted. Pedal hair growth normal.  Neurologic Normal speech. Oriented to person, place, and  time. Epicritic sensation to light touch grossly present bilaterally.  Dermatologic Nails well groomed and normal in appearance. No open wounds. No skin lesions.  Orthopedic: Pain on palpation of along the course of the peroneal tendon especially as it courses along the retromalleolar space.  No pain at the insertion of the peroneal tendon.  Pain inferior to the fibula at the sinus tarsi.  No pain with range of motion of the subtalar joint.  No clinical signs of coalition noted at this time.  Mild pain at the ATFL ligament.  No pain at the posterior tibial tendon, Achilles tendon, deep ankle joint pain.  No pain with range of motion of the ankle joint   Radiographs: 3 views of skeletally mature adult left ankle: No osteoarthritic changes noted no osteochondral lesion noted.  No fractures noted.  Flatfoot deformity noted with decreasing calcaneal inclination angle increasing talar declination anterior break in the cyma line. Assessment:   1. Left foot pain   2. Peroneal tendinitis, left   3. Sinus tarsi syndrome of left ankle    Plan:  Patient was evaluated and treated and all questions answered.  Left peroneal tendinitis versus sinus tarsi syndrome in the setting of flatfoot deformity -I explained to the patient the etiology of sinus tarsi syndrome and peroneal tendinitis and various treatment options were extensively discussed.  Given the nature of the flatfoot I believe patient will benefit from shoe gear modification as well as orthotics.  She will think about the orthotics. -Given that she has failed a lot of conservative treatment options at this time I believe patient will benefit from an MRI evaluation of the left ankle to rule out sinus tarsi syndrome versus peroneal tendinitis. -If there is no improvement we will discuss steroid injection during next clinical visit.  No follow-ups on file.

## 2022-08-12 ENCOUNTER — Ambulatory Visit: Payer: Self-pay

## 2022-09-07 ENCOUNTER — Ambulatory Visit
Admission: EM | Admit: 2022-09-07 | Discharge: 2022-09-07 | Disposition: A | Discharge status: Home / Self Care | Payer: Medicaid Other | Attending: Physician Assistant | Admitting: Physician Assistant

## 2022-09-07 ENCOUNTER — Encounter: Payer: Self-pay | Admitting: Emergency Medicine

## 2022-09-07 DIAGNOSIS — B9689 Other specified bacterial agents as the cause of diseases classified elsewhere: Secondary | ICD-10-CM | POA: Insufficient documentation

## 2022-09-07 DIAGNOSIS — Z113 Encounter for screening for infections with a predominantly sexual mode of transmission: Secondary | ICD-10-CM | POA: Insufficient documentation

## 2022-09-07 DIAGNOSIS — N764 Abscess of vulva: Secondary | ICD-10-CM | POA: Diagnosis present

## 2022-09-07 DIAGNOSIS — N760 Acute vaginitis: Secondary | ICD-10-CM | POA: Diagnosis not present

## 2022-09-07 LAB — WET PREP, GENITAL
Sperm: NONE SEEN
Trich, Wet Prep: NONE SEEN
WBC, Wet Prep HPF POC: >10 — AB (ref <10)
Yeast Wet Prep HPF POC: NONE SEEN

## 2022-09-07 MED ORDER — METRONIDAZOLE 500 MG PO TABS: 500.0000 mg | ORAL_TABLET | Freq: Two times a day (BID) | ORAL | 0 refills | Status: AC

## 2022-09-07 MED ORDER — DOXYCYCLINE HYCLATE 100 MG PO CAPS: 100.0000 mg | ORAL_CAPSULE | Freq: Two times a day (BID) | ORAL | 0 refills | Status: AC

## 2022-09-07 MED ORDER — MUPIROCIN 2 % EX OINT: 1.0000 | TOPICAL_OINTMENT | Freq: Two times a day (BID) | CUTANEOUS | 0 refills | Status: AC

## 2022-09-07 NOTE — ED Triage Notes (Signed)
Pt c/o a vaginal abscess on the right side of her vaginal. She states she first noticed it several months back but it healed. She states it has be came red, painful. She has not tried anything. She states it draining. She states there is a place underneath the abscess that itches but only at night.

## 2022-09-07 NOTE — ED Provider Notes (Signed)
[facility]    CSN: [id number] Arrival date & time: 09/07/22  1224      History   Chief Complaint Chief Complaint  Patient presents with   vaginal abscess    HPI Alexandria Hernandez is a 21 y.o. female presenting for he recurrent swollen area of the right labia majora for the past 1 year.  Patient says it becomes swollen and then drains pustular material, goes down and then recurs.  She has never had it checked out before.  She says sometimes will be painful other times it will not be.  Most recently it has been a little sore.  She has not used any over-the-counter products or treated condition in any way at home.  She denies any concerns for STIs.  She says she was last sexually active a couple of years ago.  She has not been checked for STIs.  She has never had a Pap smear.  She does not have a gynecologist.  She denies any vaginal discharge, odor, abdominal pain, urinary symptoms.  No other complaints.  HPI  History reviewed. No pertinent past medical history.  There are no problems to display for this patient.   Past Surgical History:  Procedure Laterality Date   NO PAST SURGERIES      OB History   No obstetric history on file.      Home Medications    Prior to Admission medications   Medication Sig Start Date End Date Taking? Authorizing Provider  doxycycline (VIBRAMYCIN) 100 MG capsule Take 1 capsule (100 mg total) by mouth 2 (two) times daily for 7 days. 09/07/22 09/14/22 Yes Danton Clap, PA-C  metroNIDAZOLE (FLAGYL) 500 MG tablet Take 1 tablet (500 mg total) by mouth 2 (two) times daily for 7 days. 09/07/22 09/14/22 Yes Danton Clap, PA-C  mupirocin ointment (BACTROBAN) 2 % Apply 1 Application topically 2 (two) times daily for 7 days. 09/07/22 09/14/22 Yes Danton Clap, PA-C  medroxyPROGESTERone (DEPO-PROVERA) 150 MG/ML injection Inject 150 mg into the muscle every 3 (three) months. 07/26/20   [provider]  fluticasone (FLONASE) 50 MCG/ACT  nasal spray Place 2 sprays into both nostrils daily. 03/03/18 08/14/19  Duanne Guess, PA-C    Family History Family History  Problem Relation Age of Onset   Hypertension Mother    Healthy Father     Social History Social History   Tobacco Use   Smoking status: Never   Smokeless tobacco: Never  Vaping Use   Vaping Use: Never used  Substance Use Topics   Alcohol use: No   Drug use: No     Allergies   Patient has no known allergies.   Review of Systems Review of Systems  Constitutional:  Negative for fatigue and fever.  Gastrointestinal:  Negative for abdominal pain, nausea and vomiting.  Genitourinary:  Positive for genital sores. Negative for difficulty urinating, dysuria, flank pain, hematuria, pelvic pain, vaginal discharge and vaginal pain.  Musculoskeletal:  Negative for back pain.     Physical Exam Triage Vital Signs ED Triage Vitals  Enc Vitals Group     BP      Pulse      Resp      Temp      Temp src      SpO2      Weight      Height      Head Circumference      Peak Flow      Pain Score  Pain Loc      Pain Edu?      Excl. in GC?    No data found.  Updated Vital Signs BP (!) 127/94 (BP Location: Right Arm)   Pulse 82   Temp 98.4 F (36.9 C) (Oral)   Resp 18   Ht 5\' 6"  (1.676 m)   Wt 179 lb 14.3 oz (81.6 kg)   LMP 09/02/2022 (Approximate)   SpO2 99%   BMI 29.04 kg/m   Physical Exam Vitals and nursing note reviewed.  Constitutional:      General: She is not in acute distress.    Appearance: Normal appearance. She is not ill-appearing or toxic-appearing.  HENT:     Head: Normocephalic and atraumatic.  Eyes:     General: No scleral icterus.       Right eye: No discharge.        Left eye: No discharge.     Conjunctiva/sclera: Conjunctivae normal.  Cardiovascular:     Rate and Rhythm: Normal rate and regular rhythm.     Heart sounds: Normal heart sounds.  Pulmonary:     Effort: Pulmonary effort is normal. No respiratory  distress.     Breath sounds: Normal breath sounds.  Genitourinary:    Exam position: Lithotomy position.     Labia:        Right: Lesion present.      Vagina: Vaginal discharge (milky white foul smelling vaginal discharge) present.       Comments: There is a small indurated, non erythematous area of the right labia majora as shown on image. Area has a central opening which is draining milky white material. Mildly tender.  Musculoskeletal:     Cervical back: Neck supple.  Skin:    General: Skin is dry.  Neurological:     General: No focal deficit present.     Mental Status: She is alert. Mental status is at baseline.     Motor: No weakness.     Gait: Gait normal.  Psychiatric:        Mood and Affect: Mood normal.        Behavior: Behavior normal.        Thought Content: Thought content normal.      UC Treatments / Results  Labs (all labs ordered are listed, but only abnormal results are displayed) Labs Reviewed  WET PREP, GENITAL - Abnormal; Notable for the following components:      Result Value   Clue Cells Wet Prep HPF POC PRESENT (*)    WBC, Wet Prep HPF POC >10 (*)    All other components within normal limits  CERVICOVAGINAL ANCILLARY ONLY    EKG   Radiology No results found.  Procedures Procedures (including critical care time)  Medications Ordered in UC Medications - No data to display  Initial Impression / Assessment and Plan / UC Course  I have reviewed the triage vital signs and the nursing notes.  Pertinent labs & imaging results that were available during my care of the patient were reviewed by me and considered in my medical decision making (see chart for details).   21 year old female presenting for area of swelling of the right labia that has come and gone for the past 1 year.  Presently it is a little painful and has been draining.  On exam patient appears to have small cyst/abscess below could potentially be HS.  No history of that in chart.  It  is slightly draining.  We will treat  with doxycycline to cover infection.  Also encouraged use of warm compresses and mupirocin ointment as prescribed.  Additionally on exam I noted milky white foul-smelling vaginal discharge.  Wet prep obtained.  Positive clue cells.  Will treat for BV at this time with metronidazole.  Additionally, GC/chlamydia testing performed.  Advised patient on access results.  Advised her that we will contact her and start her on the appropriate treatment if positive.  I did place a referral to gynecology so patient may receive a Pap smear and follow-up on the vaginal cyst/abscess.   Final Clinical Impressions(s) / UC Diagnoses   Final diagnoses:  Vulvar abscess  Bacterial vaginosis  Screening examination for sexually transmitted disease     Discharge Instructions      -Suspect cyst/abscess for the area of concern.  I have sent an ointment to apply to the area twice a day.  We will also start you on doxycycline antibiotic.  Warm compresses to the area. - You are positive for bacterial vaginosis.  I sent antibiotics to the pharmacy for that as well. - Your GC/chlamydia testing will be back today or tomorrow.  Results via MyChart and we will call you with any positives and recommend further treatment. - I placed a referral to gynecology for you so that you may get a Pap smear.  If there is any issue with the referral, follow-up with your primary care provider.     ED Prescriptions     Medication Sig Dispense Auth. Provider   metroNIDAZOLE (FLAGYL) 500 MG tablet Take 1 tablet (500 mg total) by mouth 2 (two) times daily for 7 days. 14 tablet Eusebio Friendly B, PA-C   doxycycline (VIBRAMYCIN) 100 MG capsule Take 1 capsule (100 mg total) by mouth 2 (two) times daily for 7 days. 14 capsule Eusebio Friendly B, PA-C   mupirocin ointment (BACTROBAN) 2 % Apply 1 Application topically 2 (two) times daily for 7 days. 22 g Shirlee Latch, PA-C      PDMP not reviewed this  encounter.   Shirlee Latch, PA-C 09/07/22 [phone]

## 2022-09-07 NOTE — Discharge Instructions (Addendum)
-  Suspect cyst/abscess for the area of concern.  I have sent an ointment to apply to the area twice a day.  We will also start you on doxycycline antibiotic.  Warm compresses to the area. - You are positive for bacterial vaginosis.  I sent antibiotics to the pharmacy for that as well. - Your GC/chlamydia testing will be back today or tomorrow.  Results via MyChart and we will call you with any positives and recommend further treatment. - I placed a referral to gynecology for you so that you may get a Pap smear.  If there is any issue with the referral, follow-up with your primary care provider.

## 2022-09-09 ENCOUNTER — Telehealth: Payer: Self-pay | Admitting: Obstetrics & Gynecology

## 2022-09-09 LAB — CERVICOVAGINAL ANCILLARY ONLY
Chlamydia: NEGATIVE
Comment: NEGATIVE
Comment: NORMAL
Neisseria Gonorrhea: NEGATIVE

## 2022-09-09 NOTE — Telephone Encounter (Signed)
[facility] referring for needs pap, vulvar abscess. Sch CJE I contacted patient via phone. I left voicemail for patient to call back to be scheduled.

## 2022-09-12 ENCOUNTER — Encounter: Payer: Self-pay | Admitting: Obstetrics & Gynecology

## 2022-09-12 NOTE — Telephone Encounter (Signed)
I contacted patient via phone. I left voicemail for patient to call back to be scheduled.  Attempts to reach patient have been unsuccessful. Mailing referral notice and contacting referring provider

## 2023-02-10 ENCOUNTER — Ambulatory Visit: Payer: Self-pay

## 2023-05-04 ENCOUNTER — Ambulatory Visit
Admission: EM | Admit: 2023-05-04 | Discharge: 2023-05-04 | Disposition: A | Discharge status: Home / Self Care | Payer: Medicaid Other | Attending: Emergency Medicine | Admitting: Emergency Medicine

## 2023-05-04 ENCOUNTER — Encounter: Payer: Self-pay | Admitting: Emergency Medicine

## 2023-05-04 DIAGNOSIS — F419 Anxiety disorder, unspecified: Secondary | ICD-10-CM

## 2023-05-04 DIAGNOSIS — L439 Lichen planus, unspecified: Secondary | ICD-10-CM | POA: Diagnosis not present

## 2023-05-04 MED ORDER — BUSPIRONE HCL 7.5 MG PO TABS: 7.5000 mg | ORAL_TABLET | Freq: Two times a day (BID) | ORAL | 3 refills | Status: AC

## 2023-05-04 MED ORDER — HYDROXYZINE HCL 25 MG PO TABS: 25.0000 mg | ORAL_TABLET | Freq: Four times a day (QID) | ORAL | 0 refills | Status: AC

## 2023-05-04 MED ORDER — FLUOCINONIDE 0.05 % EX GEL: 1.0000 | Freq: Two times a day (BID) | CUTANEOUS | 0 refills | Status: AC

## 2023-05-04 NOTE — ED Provider Notes (Signed)
[facility]    CSN: [id number] Arrival date & time: 05/04/23  1307      History   Chief Complaint Chief Complaint  Patient presents with   Anxiety    Entered by patient    HPI Alexandria Hernandez is a 22 y.o. female.   HPI  22 year old female with no past medical history presents for evaluation of possible anxiety.  She states that this has been going on for a while but she feels like it is getting worse.  She states that her symptoms consist of nausea following arguments or stressful situations that can last for a full day or longer.  There has also been intermittent heart racing but no shortness of breath, cramping of her hands and feet, or numbness around her mouth.  She states she does have nervous habits to include chewing on the inside of her cheeks and also chewing on her fingers if she does not keep her fingers manicured.  She endorses that when she was on Depo-Provera it caused her to gain weight which made her very self-conscious and she had a compulsive behavior which consisted of frequently weighing herself.  She has never sought care for anxiety in the past or been treated.  She does not currently have a PCP.  History reviewed. No pertinent past medical history.  There are no problems to display for this patient.   Past Surgical History:  Procedure Laterality Date   NO PAST SURGERIES      OB History   No obstetric history on file.      Home Medications    Prior to Admission medications   Medication Sig Start Date End Date Taking? Authorizing Provider  busPIRone (BUSPAR) 7.5 MG tablet Take 1 tablet (7.5 mg total) by mouth 2 (two) times daily. 05/04/23  Yes Becky Augusta, NP  fluocinonide gel (LIDEX) 0.05 % Apply 1 Application topically 2 (two) times daily. 05/04/23  Yes Becky Augusta, NP  hydrOXYzine (ATARAX) 25 MG tablet Take 1 tablet (25 mg total) by mouth every 6 (six) hours. 05/04/23  Yes Becky Augusta, NP  medroxyPROGESTERone (DEPO-PROVERA) 150 MG/ML  injection Inject 150 mg into the muscle every 3 (three) months. 07/26/20   [provider]  fluticasone (FLONASE) 50 MCG/ACT nasal spray Place 2 sprays into both nostrils daily. 03/03/18 08/14/19  Evon Slack, PA-C    Family History Family History  Problem Relation Age of Onset   Hypertension Mother    Healthy Father     Social History Social History   Tobacco Use   Smoking status: Never   Smokeless tobacco: Never  Vaping Use   Vaping Use: Never used  Substance Use Topics   Alcohol use: No   Drug use: No     Allergies   Patient has no known allergies.   Review of Systems Review of Systems  Gastrointestinal:  Positive for nausea.  Psychiatric/Behavioral:  The patient is nervous/anxious.      Physical Exam Triage Vital Signs ED Triage Vitals  Enc Vitals Group     BP      Pulse      Resp      Temp      Temp src      SpO2      Weight      Height      Head Circumference      Peak Flow      Pain Score      Pain Loc  Pain Edu?      Excl. in GC?    No data found.  Updated Vital Signs BP 103/66 (BP Location: Right Arm)   Pulse 81   Temp 98.5 F (36.9 C) (Oral)   Resp 18   LMP  (LMP Unknown)   SpO2 99%   Visual Acuity Right Eye Distance:   Left Eye Distance:   Bilateral Distance:    Right Eye Near:   Left Eye Near:    Bilateral Near:     Physical Exam Vitals and nursing note reviewed.  Constitutional:      Appearance: Normal appearance. She is not ill-appearing.  HENT:     Head: Normocephalic and atraumatic.     Mouth/Throat:     Mouth: Mucous membranes are moist.     Pharynx: No oropharyngeal exudate or posterior oropharyngeal erythema.     Comments: West[place] Pam white patches on the buccal aspect of both cheeks. Skin:    General: Skin is warm and dry.     Capillary Refill: Capillary refill takes less than 2 seconds.  Neurological:     General: No focal deficit present.     Mental Status: She is alert and oriented to person,  place, and time.      UC Treatments / Results  Labs (all labs ordered are listed, but only abnormal results are displayed) Labs Reviewed - No data to display  EKG   Radiology No results found.  Procedures Procedures (including critical care time)  Medications Ordered in UC Medications - No data to display  Initial Impression / Assessment and Plan / UC Course  I have reviewed the triage vital signs and the nursing notes.  Pertinent labs & imaging results that were available during my care of the patient were reviewed by me and considered in my medical decision making (see chart for details).   Patient is a nontoxic-appearing 22 year old female presenting for evaluation of possible anxiety as outlined in HPI above.  She is concerned because she has noticed an increase in compulsive behavior which includes chewing on the inside of her cheeks.  She also gets nauseous following stressful situations.  She highlighted that she became nauseous after guarding with her boyfriend and then again after her mother sister got into a verbal altercation.  This nausea will last for a full day or longer.  She is concerned as well because her friends state that she looks like she is losing weight but she has not weighed herself.  She reports that when yourself to do a compulsion to weigh herself when she was on Depo-Provera which caused her to gain weight.  She reports she has not weighed herself in a long time.  On exam patient does have lacy white patches on the inside of both cheeks    This appears to represent lichen planus, which may be have been brought on by her chewing of her cheeks.  I will treat this with topical fluocinonide gel twice daily.  That patient can use episodically.  The patient's nausea does appear to be a stress reaction and may be related to anxiety.  I will start the patient on BuSpar 7.5 mg twice daily as well as prescribed hydroxyzine that she can use as an abortive.  She has  Medicaid so I have advised her to call Medicaid and find out who her primary care provider is and to make an appointment for ongoing medication management.  I will also give her resources to include [facility] in  [place], [facility] walk-in psychiatry clinic, and the [facility] behavioral health urgent care in [place].   Final Clinical Impressions(s) / UC Diagnoses   Final diagnoses:  Lichen planus  Anxiety     Discharge Instructions      You have been evaluated today for anxiety and as we discussed anxiety is best treated with both medications and with therapy.  Start taking the BuSpar 7.5 mg twice daily to help manage her anxiety.  You may use the hydroxyzine, 1/2 to 1 tablet every 6 hours, as needed for the nausea and stress reaction or heart racing comes from stressful situations.  Call the number on the back of your Medicaid card and find out who your assigned primary care provider is.  You will need to make a new patient appointment for ongoing medication management and management of your anxiety.  You can also ask the [facility] customer service about psychiatric providers that are taking new patients in the area.  You may also reach out to [facility] clinic as they have a behavioral health clinic.  Their phone number is [phone].  You can also reach out to the [facility] adult outpatient psychiatry service located in [place].  Their phone number is [phone].  You may also access behavioral health services through the [facility] health behavioral health urgent care.  Their phone number is [phone].     ED Prescriptions     Medication Sig Dispense Auth. Provider   fluocinonide gel (LIDEX) 0.05 % Apply 1 Application topically 2 (two) times daily. 60 g Becky Augusta, NP   hydrOXYzine (ATARAX) 25 MG tablet Take 1 tablet (25 mg total) by mouth every 6 (six) hours. 12 tablet Becky Augusta, NP   busPIRone (BUSPAR) 7.5 MG tablet Take 1 tablet (7.5 mg total) by mouth 2 (two) times daily.  60 tablet Becky Augusta, NP      PDMP not reviewed this encounter.   Becky Augusta, NP 05/04/23 1415

## 2023-05-04 NOTE — Discharge Instructions (Addendum)
You have been evaluated today for anxiety and as we discussed anxiety is best treated with both medications and with therapy.  Start taking the BuSpar 7.5 mg twice daily to help manage her anxiety.  You may use the hydroxyzine, 1/2 to 1 tablet every 6 hours, as needed for the nausea and stress reaction or heart racing comes from stressful situations.  Call the number on the back of your Medicaid card and find out who your assigned primary care provider is.  You will need to make a new patient appointment for ongoing medication management and management of your anxiety.  You can also ask the [facility] customer service about psychiatric providers that are taking new patients in the area.  You may also reach out to [facility] clinic as they have a behavioral health clinic.  Their phone number is [phone].  You can also reach out to the [facility] adult outpatient psychiatry service located in [place].  Their phone number is [phone].  You may also access behavioral health services through the [facility] health behavioral health urgent care.  Their phone number is [phone].

## 2023-05-04 NOTE — ED Triage Notes (Signed)
Pt states over the past few weeks she has had a few things go on that has made her feel anxious. She has nervous feeling in her stomach. Also when she's anxious she bites the inside of her cheeks and its starting to hurt and she has white patches in that area.

## 2024-06-09 ENCOUNTER — Ambulatory Visit
Admission: RE | Admit: 2024-06-09 | Discharge: 2024-06-09 | Disposition: A | Discharge status: Home / Self Care | Source: Ambulatory Visit | Attending: Physician Assistant | Admitting: Physician Assistant

## 2024-06-09 ENCOUNTER — Ambulatory Visit (INDEPENDENT_AMBULATORY_CARE_PROVIDER_SITE_OTHER)

## 2024-06-09 VITALS — BP 121/91 | HR 95 | Temp 98.7°F | Ht 64.0 in | Wt 150.0 lb

## 2024-06-09 DIAGNOSIS — M5441 Lumbago with sciatica, right side: Secondary | ICD-10-CM

## 2024-06-09 DIAGNOSIS — G8929 Other chronic pain: Secondary | ICD-10-CM | POA: Diagnosis not present

## 2024-06-09 MED ORDER — NAPROXEN 500 MG PO TABS: 500.0000 mg | ORAL_TABLET | Freq: Two times a day (BID) | ORAL | 0 refills | Status: AC | PRN

## 2024-06-09 NOTE — ED Provider Notes (Signed)
 [facility]    CSN: [id number] Arrival date & time: 06/09/24  1244      History   Chief Complaint Chief Complaint  Patient presents with   Back Pain    Entered by patient    HPI Alexandria Hernandez is a 23 y.o. female Presenting for 1 year history of intermittent right-sided lower back pain.  Pain sometimes radiates down her right leg to her ankle and sometimes up the right side of her back.  Pain is sharp and stabbing when it radiates.  Reports pain occurs every few weeks.  It used to be every few months.  Patient recently started working as a Advertising copywriter and says that his team to make her back discomfort worse.  Says she has a difficult time getting comfortable when her pain is flared up.  Reports taking ibuprofen and says that does help somewhat.  Has never been evaluated for this back pain before.  Otherwise healthy with no chronic medical problems.  No other complaints.  HPI  History reviewed. No pertinent past medical history.  There are no active problems to display for this patient.   Past Surgical History:  Procedure Laterality Date   NO PAST SURGERIES      OB History   No obstetric history on file.      Home Medications    Prior to Admission medications   Medication Sig Start Date End Date Taking? Authorizing Provider  naproxen (NAPROSYN) 500 MG tablet Take 1 tablet (500 mg total) by mouth 2 (two) times daily as needed. 06/09/24  Yes Arvis Huxley B, PA-C  busPIRone  (BUSPAR ) 7.5 MG tablet Take 1 tablet (7.5 mg total) by mouth 2 (two) times daily. 05/04/23   Bernardino Ditch, NP  fluocinonide  gel (LIDEX ) 0.05 % Apply 1 Application topically 2 (two) times daily. 05/04/23   Bernardino Ditch, NP  hydrOXYzine  (ATARAX ) 25 MG tablet Take 1 tablet (25 mg total) by mouth every 6 (six) hours. 05/04/23   Bernardino Ditch, NP  medroxyPROGESTERone (DEPO-PROVERA) 150 MG/ML injection Inject 150 mg into the muscle every 3 (three) months. 07/26/20   [provider]  fluticasone   (FLONASE ) 50 MCG/ACT nasal spray Place 2 sprays into both nostrils daily. 03/03/18 08/14/19  Charlene Debby BROCKS, PA-C    Family History Family History  Problem Relation Age of Onset   Hypertension Mother    Healthy Father     Social History Social History   Tobacco Use   Smoking status: Never   Smokeless tobacco: Never  Vaping Use   Vaping status: Never Used  Substance Use Topics   Alcohol use: No   Drug use: No     Allergies   Patient has no known allergies.   Review of Systems Review of Systems  Gastrointestinal:  Negative for abdominal pain.  Genitourinary:  Negative for difficulty urinating, dysuria and flank pain.  Musculoskeletal:  Positive for back pain. Negative for arthralgias, gait problem and joint swelling.  Skin:  Negative for color change and rash.  Neurological:  Negative for weakness and numbness.     Physical Exam Triage Vital Signs ED Triage Vitals  Encounter Vitals Group     BP 06/09/24 1259 (!) 121/91     Girls Systolic BP Percentile --      Girls Diastolic BP Percentile --      Boys Systolic BP Percentile --      Boys Diastolic BP Percentile --      Pulse Rate 06/09/24 1259 95  Resp --      Temp 06/09/24 1259 98.7 F (37.1 C)     Temp Source 06/09/24 1259 Oral     SpO2 06/09/24 1259 100 %     Weight 06/09/24 1304 150 lb (68 kg)     Height 06/09/24 1304 5' 4 (1.626 m)     Head Circumference --      Peak Flow --      Pain Score 06/09/24 1303 9     Pain Loc --      Pain Education --      Exclude from Growth Chart --    No data found.  Updated Vital Signs BP (!) 121/91 (BP Location: Left Arm)   Pulse 95   Temp 98.7 F (37.1 C) (Oral)   Ht 5' 4 (1.626 m)   Wt 150 lb (68 kg)   LMP 05/26/2024   SpO2 100%   BMI 25.75 kg/m     Physical Exam Vitals and nursing note reviewed.  Constitutional:      General: She is not in acute distress.    Appearance: Normal appearance. She is not ill-appearing or toxic-appearing.  HENT:      Head: Normocephalic and atraumatic.   Eyes:     General: No scleral icterus.       Right eye: No discharge.        Left eye: No discharge.     Conjunctiva/sclera: Conjunctivae normal.    Cardiovascular:     Rate and Rhythm: Normal rate.  Pulmonary:     Effort: Pulmonary effort is normal. No respiratory distress.   Musculoskeletal:     Cervical back: Neck supple.     Lumbar back: Tenderness (right lumbar muscles) present. No bony tenderness. Normal range of motion. Negative right straight leg raise test and negative left straight leg raise test.     Comments: 5 out of 5 strength bilateral lower extremities.   Skin:    General: Skin is dry.   Neurological:     General: No focal deficit present.     Mental Status: She is alert. Mental status is at baseline.     Motor: No weakness.     Gait: Gait normal.   Psychiatric:        Mood and Affect: Mood normal.        Behavior: Behavior normal.      UC Treatments / Results  Labs (all labs ordered are listed, but only abnormal results are displayed) Labs Reviewed - No data to display  EKG   Radiology DG Lumbar Spine Complete Result Date: 06/09/2024 CLINICAL DATA:  One year history of low back pain EXAM: LUMBAR SPINE - COMPLETE 5 VIEW COMPARISON:  None Available. FINDINGS: There is no evidence of lumbar spine fracture. Alignment is normal. Intervertebral disc spaces are maintained. Moderate volume stool throughout the colon. IMPRESSION: 1. No acute fracture or malalignment. 2. Moderate volume stool throughout the colon. Electronically Signed   By: Limin  Xu M.D.   On: 06/09/2024 14:11    Procedures Procedures (including critical care time)  Medications Ordered in UC Medications - No data to display  Initial Impression / Assessment and Plan / UC Course  I have reviewed the triage vital signs and the nursing notes.  Pertinent labs & imaging results that were available during my care of the patient were reviewed by me and  considered in my medical decision making (see chart for details).   23 year old female presents for 1 year history of  intermittent right lower back pain with occasional radiation down the right leg and up the right side of her back.  No known injury.  No red flag signs or symptoms.  Will obtain x-ray of lumbar spine given chronicity of back pain.  X-ray is within normal limits.  No acute findings other than moderate stool volume.  Chronic lower back pain.  Sent naproxen to pharmacy.  Advised Tylenol.  Discussed warm compresses and stretching.  Reviewed multiple stretches with her.  Advised following up with orthopedics as she likely needs an MRI to evaluate for possible disc herniation, nerve impingement, etc.  Patient is understanding agreeable.  Work note was given.   Final Clinical Impressions(s) / UC Diagnoses   Final diagnoses:  Chronic right-sided low back pain with right-sided sciatica     Discharge Instructions      - X-ray is normal but given that you have had back pain for a year you really should have an MRI.  You should follow-up with orthopedics at one of the contact numbers below.  I sent anti-inflammatory medication to the pharmacy to use as needed at this time.  BACK PAIN: Stressed avoiding painful activities . RICE (REST, ICE, COMPRESSION, ELEVATION) guidelines reviewed. May alternate ice and heat. Consider use of muscle rubs, Salonpas patches, etc. Use medications as directed including muscle relaxers if prescribed. Take anti-inflammatory medications as prescribed or OTC NSAIDs/Tylenol.  F/u with PCP in 7-10 days for reexamination, and please feel free to call or return to the urgent care at any time for any questions or concerns you may have and we will be happy to help you!   BACK PAIN RED FLAGS: If the back pain acutely worsens or there are any red flag symptoms such as numbness/tingling, leg weakness, saddle anesthesia, or loss of bowel/bladder control, go immediately  to the ER. Follow up with us  as scheduled or sooner if the pain does not begin to resolve or if it worsens before the follow up    BACK PAIN: Stressed avoiding painful activities . RICE (REST, ICE, COMPRESSION, ELEVATION) guidelines reviewed. May alternate ice and heat. Consider use of muscle rubs, Salonpas patches, etc. Use medications as directed including muscle relaxers if prescribed. Take anti-inflammatory medications as prescribed or OTC NSAIDs/Tylenol.  F/u with PCP in 7-10 days for reexamination, and please feel free to call or return to the urgent care at any time for any questions or concerns you may have and we will be happy to help you!   BACK PAIN RED FLAGS: If the back pain acutely worsens or there are any red flag symptoms such as numbness/tingling, leg weakness, saddle anesthesia, or loss of bowel/bladder control, go immediately to the ER. Follow up with us  as scheduled or sooner if the pain does not begin to resolve or if it worsens before the follow up    You have a condition requiring you to follow up with Orthopedics so please call one of the following office for appointment:   [facility] Address: [address] [place], [place] [address] Phone: [phone]  [facility] [address] [place], [place] [address] Phone: [phone]  [facility] [address] [place], [place] [address] Phone: [phone]      ED Prescriptions     Medication Sig Dispense Auth. Provider   naproxen (NAPROSYN) 500 MG tablet Take 1 tablet (500 mg total) by mouth 2 (two) times daily as needed. 30 tablet Arvis Jolan NOVAK, PA-C  I have reviewed the PDMP during this encounter.   Arvis Jolan NOVAK, PA-C 06/09/24 1442

## 2024-06-09 NOTE — Discharge Instructions (Addendum)
-   X-ray is normal but given that you have had back pain for a year you really should have an MRI.  You should follow-up with orthopedics at one of the contact numbers below.  I sent anti-inflammatory medication to the pharmacy to use as needed at this time.  BACK PAIN: Stressed avoiding painful activities . RICE (REST, ICE, COMPRESSION, ELEVATION) guidelines reviewed. May alternate ice and heat. Consider use of muscle rubs, Salonpas patches, etc. Use medications as directed including muscle relaxers if prescribed. Take anti-inflammatory medications as prescribed or OTC NSAIDs/Tylenol.  F/u with PCP in 7-10 days for reexamination, and please feel free to call or return to the urgent care at any time for any questions or concerns you may have and we will be happy to help you!   BACK PAIN RED FLAGS: If the back pain acutely worsens or there are any red flag symptoms such as numbness/tingling, leg weakness, saddle anesthesia, or loss of bowel/bladder control, go immediately to the ER. Follow up with us  as scheduled or sooner if the pain does not begin to resolve or if it worsens before the follow up    BACK PAIN: Stressed avoiding painful activities . RICE (REST, ICE, COMPRESSION, ELEVATION) guidelines reviewed. May alternate ice and heat. Consider use of muscle rubs, Salonpas patches, etc. Use medications as directed including muscle relaxers if prescribed. Take anti-inflammatory medications as prescribed or OTC NSAIDs/Tylenol.  F/u with PCP in 7-10 days for reexamination, and please feel free to call or return to the urgent care at any time for any questions or concerns you may have and we will be happy to help you!   BACK PAIN RED FLAGS: If the back pain acutely worsens or there are any red flag symptoms such as numbness/tingling, leg weakness, saddle anesthesia, or loss of bowel/bladder control, go immediately to the ER. Follow up with us  as scheduled or sooner if the pain does not begin to resolve or if it  worsens before the follow up    You have a condition requiring you to follow up with Orthopedics so please call one of the following office for appointment:   [facility] Address: [address], [place], [place] [address] Phone: [phone]  [facility] [address] [place], [place] [address] Phone: [phone]  [facility] [address], [place], [place] [address] Phone: [phone]

## 2024-06-09 NOTE — ED Triage Notes (Signed)
 Pt c/o low back pain x1year  Pt states that the pain is in the right lower back and shoots down her leg and up her back at random.   Pt does not remember injuring her back and states that the pain just started.   Pt states that it feels like someone is pinching a nerve and twisting it  Pt denies vaginal odor, burning, or urinary frequency
